# Patient Record
Sex: Male | Born: 1975 | Race: White | Hispanic: No | Marital: Single | Smoking: Former smoker
Health system: Southern US, Community
[De-identification: ages and names within clinical notes are randomized; demographics above are authoritative.]

## PROBLEM LIST (undated history)

## (undated) DIAGNOSIS — F32A Depression, unspecified: Secondary | ICD-10-CM

## (undated) DIAGNOSIS — F419 Anxiety disorder, unspecified: Secondary | ICD-10-CM

## (undated) DIAGNOSIS — K219 Gastro-esophageal reflux disease without esophagitis: Secondary | ICD-10-CM

## (undated) DIAGNOSIS — F319 Bipolar disorder, unspecified: Secondary | ICD-10-CM

---

## 2021-03-27 HISTORY — PX: FRACTURE SURGERY: SHX138

## 2021-04-02 ENCOUNTER — Inpatient Hospital Stay (HOSPITAL_COMMUNITY): Payer: Worker's Compensation | Admitting: Certified Registered Nurse Anesthetist

## 2021-04-02 ENCOUNTER — Emergency Department (HOSPITAL_COMMUNITY): Payer: Worker's Compensation

## 2021-04-02 ENCOUNTER — Encounter (HOSPITAL_COMMUNITY): Payer: Self-pay

## 2021-04-02 ENCOUNTER — Encounter (HOSPITAL_COMMUNITY): Admission: EM | Disposition: A | Discharge status: Home / Self Care | Payer: Self-pay | Source: Home / Self Care

## 2021-04-02 ENCOUNTER — Inpatient Hospital Stay (HOSPITAL_COMMUNITY)
Admission: EM | Admit: 2021-04-02 | Discharge: 2021-04-05 | DRG: 511 | Disposition: A | Discharge status: Home / Self Care | Payer: Worker's Compensation | Attending: General Surgery | Admitting: General Surgery

## 2021-04-02 DIAGNOSIS — F419 Anxiety disorder, unspecified: Secondary | ICD-10-CM | POA: Diagnosis present

## 2021-04-02 DIAGNOSIS — S02401A Maxillary fracture, unspecified, initial encounter for closed fracture: Secondary | ICD-10-CM

## 2021-04-02 DIAGNOSIS — S0232XA Fracture of orbital floor, left side, initial encounter for closed fracture: Secondary | ICD-10-CM | POA: Diagnosis present

## 2021-04-02 DIAGNOSIS — K219 Gastro-esophageal reflux disease without esophagitis: Secondary | ICD-10-CM | POA: Diagnosis present

## 2021-04-02 DIAGNOSIS — S52531A Colles' fracture of right radius, initial encounter for closed fracture: Secondary | ICD-10-CM

## 2021-04-02 DIAGNOSIS — S52201A Unspecified fracture of shaft of right ulna, initial encounter for closed fracture: Secondary | ICD-10-CM

## 2021-04-02 DIAGNOSIS — S02642A Fracture of ramus of left mandible, initial encounter for closed fracture: Secondary | ICD-10-CM | POA: Diagnosis present

## 2021-04-02 DIAGNOSIS — S52301A Unspecified fracture of shaft of right radius, initial encounter for closed fracture: Secondary | ICD-10-CM

## 2021-04-02 DIAGNOSIS — S0181XA Laceration without foreign body of other part of head, initial encounter: Secondary | ICD-10-CM | POA: Diagnosis present

## 2021-04-02 DIAGNOSIS — Y9239 Other specified sports and athletic area as the place of occurrence of the external cause: Secondary | ICD-10-CM | POA: Diagnosis not present

## 2021-04-02 DIAGNOSIS — S01112A Laceration without foreign body of left eyelid and periocular area, initial encounter: Secondary | ICD-10-CM | POA: Diagnosis present

## 2021-04-02 DIAGNOSIS — Z20822 Contact with and (suspected) exposure to covid-19: Secondary | ICD-10-CM | POA: Diagnosis present

## 2021-04-02 DIAGNOSIS — Y9353 Activity, golf: Secondary | ICD-10-CM

## 2021-04-02 DIAGNOSIS — S52291B Other fracture of shaft of right ulna, initial encounter for open fracture type I or II: Secondary | ICD-10-CM | POA: Diagnosis present

## 2021-04-02 DIAGNOSIS — S52209A Unspecified fracture of shaft of unspecified ulna, initial encounter for closed fracture: Secondary | ICD-10-CM

## 2021-04-02 DIAGNOSIS — S0240FA Zygomatic fracture, left side, initial encounter for closed fracture: Secondary | ICD-10-CM | POA: Diagnosis present

## 2021-04-02 DIAGNOSIS — Z87891 Personal history of nicotine dependence: Secondary | ICD-10-CM

## 2021-04-02 DIAGNOSIS — Z23 Encounter for immunization: Secondary | ICD-10-CM

## 2021-04-02 DIAGNOSIS — S52531B Colles' fracture of right radius, initial encounter for open fracture type I or II: Secondary | ICD-10-CM | POA: Diagnosis present

## 2021-04-02 DIAGNOSIS — S0219XA Other fracture of base of skull, initial encounter for closed fracture: Secondary | ICD-10-CM | POA: Diagnosis present

## 2021-04-02 DIAGNOSIS — S0292XA Unspecified fracture of facial bones, initial encounter for closed fracture: Secondary | ICD-10-CM

## 2021-04-02 DIAGNOSIS — M25531 Pain in right wrist: Secondary | ICD-10-CM | POA: Diagnosis present

## 2021-04-02 HISTORY — PX: ORIF RADIAL FRACTURE: SHX5113

## 2021-04-02 HISTORY — PX: ORIF ULNAR FRACTURE: SHX5417

## 2021-04-02 HISTORY — DX: Gastro-esophageal reflux disease without esophagitis: K21.9

## 2021-04-02 LAB — I-STAT CHEM 8, ED
BUN: 10 mg/dL (ref 6–20)
Calcium, Ion: 1.13 mmol/L — ABNORMAL LOW (ref 1.15–1.40)
Chloride: 102 mmol/L (ref 98–111)
Creatinine, Ser: 1.1 mg/dL (ref 0.61–1.24)
Glucose, Bld: 120 mg/dL — ABNORMAL HIGH (ref 70–99)
HCT: 44 % (ref 39.0–52.0)
Hemoglobin: 15 g/dL (ref 13.0–17.0)
Potassium: 3.7 mmol/L (ref 3.5–5.1)
Sodium: 140 mmol/L (ref 135–145)
TCO2: 28 mmol/L (ref 22–32)

## 2021-04-02 LAB — CBC
HCT: 45.9 % (ref 39.0–52.0)
Hemoglobin: 14.7 g/dL (ref 13.0–17.0)
MCH: 30 pg (ref 26.0–34.0)
MCHC: 32 g/dL (ref 30.0–36.0)
MCV: 93.7 fL (ref 80.0–100.0)
Platelets: 245 10*3/uL (ref 150–400)
RBC: 4.9 MIL/uL (ref 4.22–5.81)
RDW: 12.7 % (ref 11.5–15.5)
WBC: 9.7 10*3/uL (ref 4.0–10.5)
nRBC: 0 % (ref 0.0–0.2)

## 2021-04-02 LAB — ETHANOL: Alcohol, Ethyl (B): <10 mg/dL (ref <10)

## 2021-04-02 LAB — COMPREHENSIVE METABOLIC PANEL
ALT: 32 U/L (ref 0–44)
AST: 25 U/L (ref 15–41)
Albumin: 4 g/dL (ref 3.5–5.0)
Alkaline Phosphatase: 67 U/L (ref 38–126)
Anion gap: 8 (ref 5–15)
BUN: 8 mg/dL (ref 6–20)
CO2: 26 mmol/L (ref 22–32)
Calcium: 8.7 mg/dL — ABNORMAL LOW (ref 8.9–10.3)
Chloride: 102 mmol/L (ref 98–111)
Creatinine, Ser: 1.07 mg/dL (ref 0.61–1.24)
GFR, Estimated: >60 mL/min (ref >60)
Glucose, Bld: 131 mg/dL — ABNORMAL HIGH (ref 70–99)
Potassium: 4 mmol/L (ref 3.5–5.1)
Sodium: 136 mmol/L (ref 135–145)
Total Bilirubin: 0.6 mg/dL (ref 0.3–1.2)
Total Protein: 6.8 g/dL (ref 6.5–8.1)

## 2021-04-02 LAB — PROTIME-INR
INR: 1.1 (ref 0.8–1.2)
Prothrombin Time: 13.9 seconds (ref 11.4–15.2)

## 2021-04-02 LAB — RESP PANEL BY RT-PCR (FLU A&B, COVID) ARPGX2
Influenza A by PCR: NEGATIVE
Influenza B by PCR: NEGATIVE
SARS Coronavirus 2 by RT PCR: NEGATIVE

## 2021-04-02 LAB — LACTIC ACID, PLASMA: Lactic Acid, Venous: 1.4 mmol/L (ref 0.5–1.9)

## 2021-04-02 LAB — SAMPLE TO BLOOD BANK

## 2021-04-02 SURGERY — OPEN REDUCTION INTERNAL FIXATION (ORIF) RADIAL FRACTURE
Anesthesia: General | Laterality: Right

## 2021-04-02 MED ORDER — SODIUM CHLORIDE 0.9 % IR SOLN
Status: DC | PRN
  Administered 2021-04-02: 3000 mL

## 2021-04-02 MED ORDER — CEFAZOLIN SODIUM-DEXTROSE 2-3 GM-%(50ML) IV SOLR
INTRAVENOUS | Status: DC | PRN
  Administered 2021-04-02: 2 g via INTRAVENOUS

## 2021-04-02 MED ORDER — MIDAZOLAM HCL 5 MG/5ML IJ SOLN
INTRAMUSCULAR | Status: DC | PRN
  Administered 2021-04-02: 2 mg via INTRAVENOUS

## 2021-04-02 MED ORDER — SUCCINYLCHOLINE CHLORIDE 20 MG/ML IJ SOLN
INTRAMUSCULAR | Status: DC | PRN
  Administered 2021-04-02: 120 mg via INTRAVENOUS

## 2021-04-02 MED ORDER — DEXAMETHASONE SODIUM PHOSPHATE 10 MG/ML IJ SOLN
INTRAMUSCULAR | Status: DC | PRN
  Administered 2021-04-02: 5 mg via INTRAVENOUS

## 2021-04-02 MED ORDER — MIDAZOLAM HCL 2 MG/2ML IJ SOLN
INTRAMUSCULAR | Status: AC
  Filled 2021-04-02: qty 2

## 2021-04-02 MED ORDER — LIDOCAINE HCL (CARDIAC) PF 100 MG/5ML IV SOSY
PREFILLED_SYRINGE | INTRAVENOUS | Status: DC | PRN
  Administered 2021-04-02: 40 mg via INTRAVENOUS

## 2021-04-02 MED ORDER — FENTANYL CITRATE (PF) 250 MCG/5ML IJ SOLN
INTRAMUSCULAR | Status: AC
  Filled 2021-04-02: qty 5

## 2021-04-02 MED ORDER — LACTATED RINGERS IV SOLN: INTRAVENOUS | Status: DC | PRN

## 2021-04-02 MED ORDER — DEXMEDETOMIDINE (PRECEDEX) IN NS 20 MCG/5ML (4 MCG/ML) IV SYRINGE
PREFILLED_SYRINGE | INTRAVENOUS | Status: DC | PRN
Start: 2021-04-02 — End: 2021-04-03
  Administered 2021-04-02: 20 ug via INTRAVENOUS

## 2021-04-02 MED ORDER — SUGAMMADEX SODIUM 200 MG/2ML IV SOLN
INTRAVENOUS | Status: DC | PRN
Start: 2021-04-02 — End: 2021-04-03
  Administered 2021-04-02: 200 mg via INTRAVENOUS

## 2021-04-02 MED ORDER — ONDANSETRON HCL 4 MG/2ML IJ SOLN
INTRAMUSCULAR | Status: DC | PRN
  Administered 2021-04-02: 4 mg via INTRAVENOUS

## 2021-04-02 MED ORDER — PROPOFOL 10 MG/ML IV BOLUS
INTRAVENOUS | Status: DC | PRN
Start: 2021-04-02 — End: 2021-04-03
  Administered 2021-04-02: 120 mg via INTRAVENOUS

## 2021-04-02 MED ORDER — PROPOFOL 10 MG/ML IV BOLUS
INTRAVENOUS | Status: AC
  Filled 2021-04-02: qty 20

## 2021-04-02 MED ORDER — CEFAZOLIN SODIUM-DEXTROSE 2-4 GM/100ML-% IV SOLN
INTRAVENOUS | Status: AC
  Filled 2021-04-02: qty 100

## 2021-04-02 MED ORDER — 0.9 % SODIUM CHLORIDE (POUR BTL) OPTIME
TOPICAL | Status: DC | PRN
  Administered 2021-04-02: 1000 mL

## 2021-04-02 MED ORDER — LIDOCAINE 2% (20 MG/ML) 5 ML SYRINGE
INTRAMUSCULAR | Status: AC
  Filled 2021-04-02: qty 5

## 2021-04-02 MED ORDER — FENTANYL CITRATE (PF) 100 MCG/2ML IJ SOLN
50.0000 ug | Freq: Once | INTRAMUSCULAR | Status: AC
  Administered 2021-04-02: 50 ug via INTRAVENOUS
  Filled 2021-04-02: qty 2

## 2021-04-02 MED ORDER — CEFAZOLIN SODIUM-DEXTROSE 2-4 GM/100ML-% IV SOLN
2.0000 g | Freq: Once | INTRAVENOUS | Status: AC
  Administered 2021-04-02: 2 g via INTRAVENOUS
  Filled 2021-04-02: qty 100

## 2021-04-02 MED ORDER — ROCURONIUM BROMIDE 100 MG/10ML IV SOLN
INTRAVENOUS | Status: DC | PRN
  Administered 2021-04-02: 70 mg via INTRAVENOUS

## 2021-04-02 MED ORDER — CEFAZOLIN SODIUM-DEXTROSE 2-4 GM/100ML-% IV SOLN
2.0000 g | Freq: Three times a day (TID) | INTRAVENOUS | Status: AC
  Administered 2021-04-03 (×3): 2 g via INTRAVENOUS
  Filled 2021-04-02 (×3): qty 100

## 2021-04-02 MED ORDER — TETANUS-DIPHTH-ACELL PERTUSSIS 5-2.5-18.5 LF-MCG/0.5 IM SUSY
0.5000 mL | PREFILLED_SYRINGE | Freq: Once | INTRAMUSCULAR | Status: AC
  Administered 2021-04-02: 0.5 mL via INTRAMUSCULAR

## 2021-04-02 MED ORDER — HYDROMORPHONE HCL 1 MG/ML IJ SOLN
1.0000 mg | Freq: Once | INTRAMUSCULAR | Status: AC
  Administered 2021-04-02: 1 mg via INTRAVENOUS
  Filled 2021-04-02: qty 1

## 2021-04-02 MED ORDER — ONDANSETRON HCL 4 MG/2ML IJ SOLN
4.0000 mg | Freq: Once | INTRAMUSCULAR | Status: AC
  Administered 2021-04-02: 4 mg via INTRAVENOUS
  Filled 2021-04-02: qty 2

## 2021-04-02 MED ORDER — FENTANYL CITRATE (PF) 100 MCG/2ML IJ SOLN
INTRAMUSCULAR | Status: DC | PRN
  Administered 2021-04-02: 100 ug via INTRAVENOUS
  Administered 2021-04-02: 50 ug via INTRAVENOUS

## 2021-04-02 SURGICAL SUPPLY — 50 items
APL PRP STRL LF DISP 70% ISPRP (MISCELLANEOUS)
BIT DRILL 2.8 QUICK RELEASE (BIT) ×1 IMPLANT
BLADE SURG 15 STRL LF DISP TIS (BLADE) ×2 IMPLANT
BLADE SURG 15 STRL SS (BLADE) ×4
BNDG CMPR 9X4 STRL LF SNTH (GAUZE/BANDAGES/DRESSINGS) ×1
BNDG ELASTIC 3X5.8 VLCR STR LF (GAUZE/BANDAGES/DRESSINGS) ×2 IMPLANT
BNDG ELASTIC 4X5.8 VLCR STR LF (GAUZE/BANDAGES/DRESSINGS) ×2 IMPLANT
BNDG ESMARK 4X9 LF (GAUZE/BANDAGES/DRESSINGS) ×2 IMPLANT
BNDG GAUZE ELAST 4 BULKY (GAUZE/BANDAGES/DRESSINGS) ×2 IMPLANT
CANISTER SUCT 3000ML PPV (MISCELLANEOUS) ×2 IMPLANT
CHLORAPREP W/TINT 26 (MISCELLANEOUS) IMPLANT
CORD BIPOLAR FORCEPS 12FT (ELECTRODE) ×2 IMPLANT
COVER SURGICAL LIGHT HANDLE (MISCELLANEOUS) ×2 IMPLANT
COVER WAND RF STERILE (DRAPES) IMPLANT
CUFF TOURN SGL QUICK 18X4 (TOURNIQUET CUFF) ×2 IMPLANT
DRAPE OEC MINIVIEW 54X84 (DRAPES) ×2 IMPLANT
DRAPE SURG 17X23 STRL (DRAPES) ×2 IMPLANT
DRILL 2.8 QUICK RELEASE (BIT) ×2
DRSG XEROFORM 1X8 (GAUZE/BANDAGES/DRESSINGS) ×2 IMPLANT
GAUZE SPONGE 4X4 12PLY STRL (GAUZE/BANDAGES/DRESSINGS) ×2 IMPLANT
GAUZE SPONGE 4X4 12PLY STRL LF (GAUZE/BANDAGES/DRESSINGS) ×2 IMPLANT
GAUZE XEROFORM 1X8 LF (GAUZE/BANDAGES/DRESSINGS) IMPLANT
GLOVE INDICATOR 8.0 STRL GRN (GLOVE) ×4 IMPLANT
GLOVE SURG SYN 7.5  E (GLOVE) ×1
GLOVE SURG SYN 7.5 E (GLOVE) ×1 IMPLANT
GOWN STRL REUS W/ TWL LRG LVL3 (GOWN DISPOSABLE) ×3 IMPLANT
GOWN STRL REUS W/TWL LRG LVL3 (GOWN DISPOSABLE) ×6
KIT BASIN OR (CUSTOM PROCEDURE TRAY) ×2 IMPLANT
KIT TURNOVER KIT B (KITS) ×2 IMPLANT
NS IRRIG 1000ML POUR BTL (IV SOLUTION) ×2 IMPLANT
PACK ORTHO EXTREMITY (CUSTOM PROCEDURE TRAY) ×2 IMPLANT
PAD ARMBOARD 7.5X6 YLW CONV (MISCELLANEOUS) ×4 IMPLANT
PAD CAST 3X4 CTTN HI CHSV (CAST SUPPLIES) ×1 IMPLANT
PAD CAST 4YDX4 CTTN HI CHSV (CAST SUPPLIES) ×1 IMPLANT
PADDING CAST COTTON 3X4 STRL (CAST SUPPLIES) ×2
PADDING CAST COTTON 4X4 STRL (CAST SUPPLIES) ×2
PLATE 8 HOLE RADIUS (Plate) ×2 IMPLANT
PLATE ULNA MIDSHAFT 6 HOLE (Plate) ×2 IMPLANT
SCREW HEXALOBE NON-LOCK 3.5X14 (Screw) ×6 IMPLANT
SCREW NON LOCK 3.5X10MM (Screw) ×4 IMPLANT
SCREW NONLOCK HEX 3.5X12 (Screw) ×18 IMPLANT
SPLINT FIBERGLASS 3X35 (CAST SUPPLIES) ×2 IMPLANT
SUT PROLENE 4 0 PS 2 18 (SUTURE) ×4 IMPLANT
SUT VIC AB 3-0 PS2 18 (SUTURE) ×4 IMPLANT
SYR CONTROL 10ML LL (SYRINGE) IMPLANT
TOWEL GREEN STERILE (TOWEL DISPOSABLE) ×2 IMPLANT
TOWEL GREEN STERILE FF (TOWEL DISPOSABLE) ×2 IMPLANT
TUBE CONNECTING 12X1/4 (SUCTIONS) ×2 IMPLANT
UNDERPAD 30X36 HEAVY ABSORB (UNDERPADS AND DIAPERS) ×2 IMPLANT
WATER STERILE IRR 1000ML POUR (IV SOLUTION) ×2 IMPLANT
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
## 2021-04-02 NOTE — ED Notes (Signed)
Masayuki Sakai (Stepmother) [phone], req update on pt
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
## 2021-04-02 NOTE — ED Notes (Signed)
Pt has laceration to left eye area. Abrasions to forehead and area under nose. Swelling to face present. Face cleaned with NS.
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
## 2021-04-02 NOTE — Anesthesia Preprocedure Evaluation (Signed)
Anesthesia Evaluation    Airway Mallampati: III  TM Distance: >3 FB Neck ROM: Full  Mouth opening: Limited Mouth Opening  Dental  (+) Teeth Intact, Dental Advisory Given   Pulmonary    breath sounds clear to auscultation       Cardiovascular  Rhythm:Regular Rate:Normal     Neuro/Psych    GI/Hepatic GERD  ,  Endo/Other    Renal/GU      Musculoskeletal   Abdominal Normal abdominal exam  (+)   Peds  Hematology   Anesthesia Other Findings Multilple facial and lip abrasions.   Reproductive/Obstetrics                             Anesthesia Physical Anesthesia Plan  ASA: II and emergent  Anesthesia Plan: General   Post-op Pain Management:    Induction: Rapid sequence and Cricoid pressure planned  PONV Risk Score and Plan: 3 and Ondansetron, Dexamethasone and Midazolam  Airway Management Planned: Oral ETT  Additional Equipment: None  Intra-op Plan:   Post-operative Plan: Extubation in OR  Informed Consent:   Plan Discussed with: CRNA  Anesthesia Plan Comments:         Anesthesia Quick Evaluation

## 2021-04-02 NOTE — Anesthesia Procedure Notes (Signed)
Procedure Name: Intubation Date/Time: 04/02/2021 9:36 PM Performed by: Gennette Shadix T, CRNA Pre-anesthesia Checklist: Patient identified, Emergency Drugs available, Suction available and Patient being monitored Patient Re-evaluated:Patient Re-evaluated prior to induction Oxygen Delivery Method: Circle system utilized Preoxygenation: Pre-oxygenation with 100% oxygen Induction Type: IV induction and Rapid sequence Ventilation: Mask ventilation without difficulty Laryngoscope Size: Mac and 4 Grade View: Grade I Tube type: Oral Tube size: 7.5 mm Number of attempts: 1 Airway Equipment and Method: Stylet and Oral airway Placement Confirmation: ETT inserted through vocal cords under direct vision,  positive ETCO2 and breath sounds checked- equal and bilateral Secured at: 21 cm Tube secured with: Tape Dental Injury: Teeth and Oropharynx as per pre-operative assessment

## 2021-04-03 ENCOUNTER — Other Ambulatory Visit: Payer: Self-pay

## 2021-04-03 ENCOUNTER — Encounter (HOSPITAL_COMMUNITY): Payer: Self-pay | Admitting: General Surgery

## 2021-04-03 DIAGNOSIS — S02401A Maxillary fracture, unspecified, initial encounter for closed fracture: Secondary | ICD-10-CM

## 2021-04-03 DIAGNOSIS — S52531A Colles' fracture of right radius, initial encounter for closed fracture: Secondary | ICD-10-CM

## 2021-04-03 DIAGNOSIS — S52209A Unspecified fracture of shaft of unspecified ulna, initial encounter for closed fracture: Secondary | ICD-10-CM

## 2021-04-03 LAB — CBC
HCT: 44.7 % (ref 39.0–52.0)
Hemoglobin: 14.6 g/dL (ref 13.0–17.0)
MCH: 30.1 pg (ref 26.0–34.0)
MCHC: 32.7 g/dL (ref 30.0–36.0)
MCV: 92.2 fL (ref 80.0–100.0)
Platelets: 236 10*3/uL (ref 150–400)
RBC: 4.85 MIL/uL (ref 4.22–5.81)
RDW: 12.7 % (ref 11.5–15.5)
WBC: 13.3 10*3/uL — ABNORMAL HIGH (ref 4.0–10.5)
nRBC: 0 % (ref 0.0–0.2)

## 2021-04-03 LAB — URINALYSIS, ROUTINE W REFLEX MICROSCOPIC
Bilirubin Urine: NEGATIVE
Glucose, UA: NEGATIVE mg/dL
Hgb urine dipstick: NEGATIVE
Ketones, ur: NEGATIVE mg/dL
Leukocytes,Ua: NEGATIVE
Nitrite: NEGATIVE
Protein, ur: NEGATIVE mg/dL
Specific Gravity, Urine: 1.005 (ref 1.005–1.030)
pH: 6 (ref 5.0–8.0)

## 2021-04-03 LAB — BASIC METABOLIC PANEL
Anion gap: 10 (ref 5–15)
BUN: 6 mg/dL (ref 6–20)
CO2: 26 mmol/L (ref 22–32)
Calcium: 8.7 mg/dL — ABNORMAL LOW (ref 8.9–10.3)
Chloride: 100 mmol/L (ref 98–111)
Creatinine, Ser: 1.01 mg/dL (ref 0.61–1.24)
GFR, Estimated: >60 mL/min (ref >60)
Glucose, Bld: 144 mg/dL — ABNORMAL HIGH (ref 70–99)
Potassium: 4.2 mmol/L (ref 3.5–5.1)
Sodium: 136 mmol/L (ref 135–145)

## 2021-04-03 LAB — HIV ANTIBODY (ROUTINE TESTING W REFLEX): HIV Screen 4th Generation wRfx: NONREACTIVE

## 2021-04-03 MED ORDER — ONDANSETRON HCL 4 MG/2ML IJ SOLN
4.0000 mg | Freq: Four times a day (QID) | INTRAMUSCULAR | Status: DC | PRN
  Administered 2021-04-03: 4 mg via INTRAVENOUS
  Filled 2021-04-03: qty 2

## 2021-04-03 MED ORDER — ONDANSETRON 4 MG PO TBDP: 4.0000 mg | ORAL_TABLET | Freq: Four times a day (QID) | ORAL | Status: DC | PRN

## 2021-04-03 MED ORDER — PANTOPRAZOLE SODIUM 40 MG PO TBEC: 40.0000 mg | DELAYED_RELEASE_TABLET | Freq: Every day | ORAL | Status: DC

## 2021-04-03 MED ORDER — ACETAMINOPHEN 325 MG PO TABS: 650.0000 mg | ORAL_TABLET | ORAL | Status: DC | PRN

## 2021-04-03 MED ORDER — ACETAMINOPHEN 160 MG/5ML PO SOLN: 325.0000 mg | Freq: Once | ORAL | Status: DC | PRN

## 2021-04-03 MED ORDER — ACETAMINOPHEN 10 MG/ML IV SOLN
1000.0000 mg | Freq: Once | INTRAVENOUS | Status: DC | PRN
  Administered 2021-04-03: 1000 mg via INTRAVENOUS

## 2021-04-03 MED ORDER — OXYCODONE HCL 5 MG PO TABS
5.0000 mg | ORAL_TABLET | ORAL | Status: DC | PRN
  Administered 2021-04-04 – 2021-04-05 (×3): 5 mg via ORAL
  Filled 2021-04-03 (×3): qty 1

## 2021-04-03 MED ORDER — METHOCARBAMOL 500 MG PO TABS
750.0000 mg | ORAL_TABLET | Freq: Three times a day (TID) | ORAL | Status: DC
  Administered 2021-04-03 – 2021-04-05 (×8): 750 mg via ORAL
  Filled 2021-04-03 (×8): qty 2

## 2021-04-03 MED ORDER — LACTATED RINGERS IV SOLN: INTRAVENOUS | Status: DC

## 2021-04-03 MED ORDER — PANTOPRAZOLE SODIUM 40 MG PO TBEC
40.0000 mg | DELAYED_RELEASE_TABLET | Freq: Every day | ORAL | Status: DC
  Administered 2021-04-04 – 2021-04-05 (×2): 40 mg via ORAL
  Filled 2021-04-03 (×2): qty 1

## 2021-04-03 MED ORDER — PANTOPRAZOLE SODIUM 40 MG IV SOLR
40.0000 mg | Freq: Every day | INTRAVENOUS | Status: DC
  Administered 2021-04-03: 40 mg via INTRAVENOUS
  Filled 2021-04-03: qty 40

## 2021-04-03 MED ORDER — AMISULPRIDE (ANTIEMETIC) 5 MG/2ML IV SOLN: 10.0000 mg | Freq: Once | INTRAVENOUS | Status: DC | PRN

## 2021-04-03 MED ORDER — MORPHINE SULFATE (PF) 4 MG/ML IV SOLN
4.0000 mg | INTRAVENOUS | Status: DC | PRN
  Administered 2021-04-03 – 2021-04-04 (×3): 4 mg via INTRAVENOUS
  Filled 2021-04-03 (×3): qty 1

## 2021-04-03 MED ORDER — PROMETHAZINE HCL 25 MG/ML IJ SOLN: 6.2500 mg | INTRAMUSCULAR | Status: DC | PRN

## 2021-04-03 MED ORDER — KETOROLAC TROMETHAMINE 15 MG/ML IJ SOLN
15.0000 mg | Freq: Once | INTRAMUSCULAR | Status: AC
  Administered 2021-04-03: 15 mg via INTRAVENOUS
  Filled 2021-04-03: qty 1

## 2021-04-03 MED ORDER — POTASSIUM CHLORIDE IN NACL 20-0.45 MEQ/L-% IV SOLN
INTRAVENOUS | Status: DC
  Filled 2021-04-03 (×6): qty 1000

## 2021-04-03 MED ORDER — OXYCODONE HCL 5 MG PO TABS
10.0000 mg | ORAL_TABLET | ORAL | Status: DC | PRN
  Administered 2021-04-03 – 2021-04-05 (×7): 10 mg via ORAL
  Filled 2021-04-03 (×8): qty 2

## 2021-04-03 MED ORDER — PANTOPRAZOLE SODIUM 40 MG IV SOLR: 40.0000 mg | Freq: Every day | INTRAVENOUS | Status: DC

## 2021-04-03 MED ORDER — ENOXAPARIN SODIUM 30 MG/0.3ML IJ SOSY
30.0000 mg | PREFILLED_SYRINGE | Freq: Two times a day (BID) | INTRAMUSCULAR | Status: DC
  Administered 2021-04-03 – 2021-04-05 (×4): 30 mg via SUBCUTANEOUS
  Filled 2021-04-03 (×4): qty 0.3

## 2021-04-03 MED ORDER — ACETAMINOPHEN 10 MG/ML IV SOLN
INTRAVENOUS | Status: AC
  Filled 2021-04-03: qty 100

## 2021-04-03 MED ORDER — ACETAMINOPHEN 325 MG PO TABS: 325.0000 mg | ORAL_TABLET | Freq: Once | ORAL | Status: DC | PRN

## 2021-04-03 MED ORDER — MEPERIDINE HCL 25 MG/ML IJ SOLN: 6.2500 mg | INTRAMUSCULAR | Status: DC | PRN

## 2021-04-03 MED ORDER — HYDROMORPHONE HCL 1 MG/ML IJ SOLN: 0.2500 mg | INTRAMUSCULAR | Status: DC | PRN

## 2021-04-03 NOTE — Progress Notes (Signed)
   Ortho Hand Progress Note  Subjective: No acute events last night.  Pain in right arm controlled.  Denies numbness or tingling.   Objective: Vital signs in last 24 hours: Temp:  [97.6 F (36.4 C)-98.6 F (37 C)] 98.1 F (36.7 C) (05/08 1418) Pulse Rate:  [43-123] 72 (05/08 1418) Resp:  [11-24] 18 (05/08 1418) BP: (118-146)/(73-95) 118/82 (05/08 1418) SpO2:  [90 %-100 %] 96 % (05/08 1418) Weight:  [77.1 kg] 77.1 kg (05/07 1727)  Intake/Output from previous day: 05/07 0701 - 05/08 0700 In: 1374.5 [I.V.:1274.5; IV Piggyback:100] Out: 1975 [Urine:1950; Blood:25] Intake/Output this shift: No intake/output data recorded.  Recent Labs    04/02/21 1725 04/02/21 1758 04/03/21 0144  HGB 14.7 15.0 14.6   Recent Labs    04/02/21 1725 04/02/21 1758 04/03/21 0144  WBC 9.7  --  13.3*  RBC 4.90  --  4.85  HCT 45.9 44.0 44.7  PLT 245  --  236   Recent Labs    04/02/21 1725 04/02/21 1758 04/03/21 0144  NA 136 140 136  K 4.0 3.7 4.2  CL 102 102 100  CO2 26  --  26  BUN 8 10 6   CREATININE 1.07 1.10 1.01  GLUCOSE 131* 120* 144*  CALCIUM 8.7*  --  8.7*   Recent Labs    04/02/21 1725  INR 1.1    Aaox3 nad Resp nonlabored RRR Right upper extremity: Sugar-tong splint in place to the right arm exposed digits with minimal swelling.  Intact active flexion and extension throughout all digits.  Motor intact to the AIN, PIN and ulnar nerve distributions.  Fingertips are warm well perfused with brisk capillary refill.  Intact sensation throughout all fingertips.   Assessment/Plan: Right type I open radius and ulna fracture status post irrigation and debridement of open fracture and ORIF of radius and ulnar shaft fractures.  Date of surgery 04/02/2021  - Trauma primary. Appreciate management - Elevate RUE - NWB RUE. Encourage digit ROM - Finish IV abx for open fracture coverage  Remainder per primary  OK for discharge from hand standpoint. Follow up in 10-14 days post  op   08-22-1979 III 04/03/2021, 2:44 PM  [phone]

## 2021-04-03 NOTE — Progress Notes (Signed)
NEW ADMISSION NOTE New Admission Note:    Arrival Method: Bed Mental Orientation: A&O x4 Telemetry: none   Assessment: Completed Skin: See assessment  IV: Clean, dry, intact Pain: 6/10 Tubes: none Safety Measures: Safety Fall Prevention Plan has been given, discussed and signed Admission: Completed 5 [place]  Orientation: Patient has been orientated to the room, unit and staff.  Family: none present   Orders have been reviewed and implemented. Will continue to monitor the patient. Call light has been placed within reach and bed alarm has been activated.    Robert Bellow, RN
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
## 2021-04-03 NOTE — Anesthesia Postprocedure Evaluation (Signed)
Anesthesia Post Note  Patient: Chris Stanley  Procedure(s) Performed: OPEN REDUCTION INTERNAL FIXATION (ORIF) RADIAL FRACTURE (Right ) OPEN REDUCTION INTERNAL FIXATION (ORIF) ULNAR FRACTURE (Right )     Patient location during evaluation: PACU Anesthesia Type: General Level of consciousness: awake and alert Pain management: pain level controlled Vital Signs Assessment: post-procedure vital signs reviewed and stable Respiratory status: spontaneous breathing, nonlabored ventilation, respiratory function stable and patient connected to nasal cannula oxygen Cardiovascular status: blood pressure returned to baseline and stable Postop Assessment: no apparent nausea or vomiting Anesthetic complications: no   No complications documented.  Last Vitals:  Vitals:   04/03/21 0030 04/03/21 0100  BP: (!) 146/90 (!) 132/92  Pulse: 78 86  Resp: 14 16  Temp: 36.6 C 37 C  SpO2: 100% 99%    Last Pain:  Vitals:   04/03/21 0240  TempSrc:   PainSc: Asleep                 Shelton Silvas

## 2021-04-03 NOTE — Plan of Care (Signed)

## 2021-04-03 NOTE — Progress Notes (Signed)
Bedside shift report complete. Received patient awake,alert/orientedx4 and able to verbalize needs. NAD noted; respirations easy/even on room air. Facial bruising/ abrasions noted to L side. Bruised L eye noted. Splint/dressing to RUE c/d/i. Movement noted to all extremities. Whiteboard updated. All safety measures in place and personal belongings within reach.

## 2021-04-03 NOTE — Progress Notes (Signed)
Limited information obtained for admission documents as patient still under anesthesia. Will follow up.

## 2021-04-03 NOTE — Plan of Care (Signed)

## 2021-04-03 NOTE — Transfer of Care (Signed)
Immediate Anesthesia Transfer of Care Note  Patient: Chris Stanley  Procedure(s) Performed: OPEN REDUCTION INTERNAL FIXATION (ORIF) RADIAL FRACTURE (Right ) OPEN REDUCTION INTERNAL FIXATION (ORIF) ULNAR FRACTURE (Right )  Patient Location: PACU  Anesthesia Type:General  Level of Consciousness: drowsy  Airway & Oxygen Therapy: Patient Spontanous Breathing and Patient connected to nasal cannula oxygen  Post-op Assessment: Report given to RN, Post -op Vital signs reviewed and stable and Patient moving all extremities  Post vital signs: Reviewed and stable  Last Vitals:  Vitals Value Taken Time  BP 125/73 04/02/21 2359  Temp    Pulse 99 04/03/21 0002  Resp 16 04/03/21 0002  SpO2 100 % 04/03/21 0002  Vitals shown include unvalidated device data.  Last Pain:  Vitals:   04/02/21 1949  TempSrc: Oral  PainSc:          Complications: No complications documented.

## 2021-04-04 ENCOUNTER — Inpatient Hospital Stay (HOSPITAL_COMMUNITY): Payer: Worker's Compensation

## 2021-04-04 ENCOUNTER — Encounter (HOSPITAL_BASED_OUTPATIENT_CLINIC_OR_DEPARTMENT_OTHER): Payer: Self-pay | Admitting: Plastic Surgery

## 2021-04-04 ENCOUNTER — Other Ambulatory Visit: Payer: Self-pay

## 2021-04-04 DIAGNOSIS — S02642A Fracture of ramus of left mandible, initial encounter for closed fracture: Secondary | ICD-10-CM

## 2021-04-04 MED ORDER — IOHEXOL 300 MG/ML  SOLN
75.0000 mL | Freq: Once | INTRAMUSCULAR | Status: AC | PRN
  Administered 2021-04-04: 75 mL via INTRAVENOUS

## 2021-04-04 MED ORDER — IOHEXOL 9 MG/ML PO SOLN
500.0000 mL | ORAL | Status: AC
  Administered 2021-04-04 (×2): 500 mL via ORAL

## 2021-04-04 MED ORDER — BACITRACIN ZINC 500 UNIT/GM EX OINT
TOPICAL_OINTMENT | CUTANEOUS | Status: DC | PRN
  Filled 2021-04-04: qty 28.4

## 2021-04-04 MED ORDER — HYDROXYZINE HCL 25 MG PO TABS: 25.0000 mg | ORAL_TABLET | Freq: Three times a day (TID) | ORAL | Status: DC | PRN

## 2021-04-04 MED ORDER — NOREPINEPHRINE 4 MG/250ML-% IV SOLN
INTRAVENOUS | Status: AC
  Filled 2021-04-04: qty 250
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
## 2021-04-04 NOTE — Progress Notes (Signed)
Bedside shift report complete. Received patient awake,alert/orientedx4 and able to verbalize needs. NAD noted; respirations easy/even on room air. Facial bruising/ abrasions noted to L side. Bruised L eye noted. Splint/dressing to RUE c/d/i. Movement noted to all extremities. Whiteboard updated. SCDs on to bilateral lower extremities. All safety measures in place and personal belongings within reach.
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
## 2021-04-04 NOTE — Plan of Care (Signed)
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
## 2021-04-04 NOTE — Progress Notes (Signed)
   Ortho Hand Progress Note  Subjective: No acute events last night.  Pain in right arm controlled.  Denies numbness or tingling.   Objective: Vital signs in last 24 hours: Temp:  [98.1 F (36.7 C)-98.6 F (37 C)] 98.4 F (36.9 C) (05/09 0536) Pulse Rate:  [60-74] 60 (05/09 0536) Resp:  [18] 18 (05/09 0536) BP: (113-147)/(82-86) 113/82 (05/09 0536) SpO2:  [96 %-100 %] 100 % (05/09 0536)  Intake/Output from previous day: 05/08 0701 - 05/09 0700 In: -  Out: 2125 [Urine:2125] Intake/Output this shift: No intake/output data recorded.  Recent Labs    04/02/21 1725 04/02/21 1758 04/03/21 0144  HGB 14.7 15.0 14.6   Recent Labs    04/02/21 1725 04/02/21 1758 04/03/21 0144  WBC 9.7  --  13.3*  RBC 4.90  --  4.85  HCT 45.9 44.0 44.7  PLT 245  --  236   Recent Labs    04/02/21 1725 04/02/21 1758 04/03/21 0144  NA 136 140 136  K 4.0 3.7 4.2  CL 102 102 100  CO2 26  --  26  BUN 8 10 6   CREATININE 1.07 1.10 1.01  GLUCOSE 131* 120* 144*  CALCIUM 8.7*  --  8.7*   Recent Labs    04/02/21 1725  INR 1.1    Aaox3 nad Resp nonlabored RRR Right upper extremity: Sugar-tong splint in place to the right arm exposed digits with minimal swelling.  Intact active flexion and extension throughout all digits.  Motor intact to the AIN, PIN and ulnar nerve distributions.  Fingertips are warm well perfused with brisk capillary refill.  Intact sensation throughout all fingertips.   Assessment/Plan: Right type I open radius and ulna fracture status post irrigation and debridement of open fracture and ORIF of radius and ulnar shaft fractures.  Date of surgery 04/02/2021  - Trauma primary. Appreciate management - Elevate RUE - NWB RUE. Encourage digit ROM - abx completed  Remainder per primary  OK for discharge from hand standpoint. Follow up in 10-14 days post op   08-22-1979 III 04/04/2021, 8:13 AM  [phone]
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
## 2021-04-05 ENCOUNTER — Inpatient Hospital Stay (HOSPITAL_COMMUNITY): Payer: Worker's Compensation

## 2021-04-05 ENCOUNTER — Other Ambulatory Visit (HOSPITAL_COMMUNITY): Payer: Medicaid Other

## 2021-04-05 LAB — CBC
HCT: 43.6 % (ref 39.0–52.0)
Hemoglobin: 14.3 g/dL (ref 13.0–17.0)
MCH: 30 pg (ref 26.0–34.0)
MCHC: 32.8 g/dL (ref 30.0–36.0)
MCV: 91.4 fL (ref 80.0–100.0)
Platelets: 207 10*3/uL (ref 150–400)
RBC: 4.77 MIL/uL (ref 4.22–5.81)
RDW: 12.5 % (ref 11.5–15.5)
WBC: 6.1 10*3/uL (ref 4.0–10.5)
nRBC: 0 % (ref 0.0–0.2)

## 2021-04-05 LAB — SARS CORONAVIRUS 2 (TAT 6-24 HRS): SARS Coronavirus 2: NEGATIVE

## 2021-04-05 MED ORDER — POLYETHYLENE GLYCOL 3350 17 G PO PACK: 17.0000 g | PACK | Freq: Every day | ORAL | 0 refills | Status: DC | PRN

## 2021-04-05 MED ORDER — POLYETHYLENE GLYCOL 3350 17 G PO PACK: 17.0000 g | PACK | Freq: Every day | ORAL | Status: DC | PRN

## 2021-04-05 MED ORDER — DOCUSATE SODIUM 100 MG PO CAPS
100.0000 mg | ORAL_CAPSULE | Freq: Two times a day (BID) | ORAL | Status: DC
  Administered 2021-04-05: 100 mg via ORAL
  Filled 2021-04-05: qty 1

## 2021-04-05 MED ORDER — DOCUSATE SODIUM 100 MG PO CAPS: 100.0000 mg | ORAL_CAPSULE | Freq: Two times a day (BID) | ORAL | 0 refills | Status: AC

## 2021-04-05 MED ORDER — BACITRACIN ZINC 500 UNIT/GM EX OINT: TOPICAL_OINTMENT | CUTANEOUS | 0 refills | Status: DC | PRN

## 2021-04-05 MED ORDER — OXYCODONE HCL 5 MG PO TABS: 5.0000 mg | ORAL_TABLET | Freq: Four times a day (QID) | ORAL | 0 refills | Status: AC | PRN

## 2021-04-05 MED ORDER — METHOCARBAMOL 500 MG PO TABS: 750.0000 mg | ORAL_TABLET | Freq: Three times a day (TID) | ORAL | 0 refills | Status: AC | PRN

## 2021-04-05 MED ORDER — ACETAMINOPHEN 325 MG PO TABS: 650.0000 mg | ORAL_TABLET | ORAL | 0 refills | Status: AC | PRN
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
## 2021-04-05 NOTE — TOC Progression Note (Addendum)
Transition of Care [facility]) - Progression Note    Patient Details  Name: Chris Stanley MRN: [id number] Date of Birth: January 02, 1976  [facility] CM/SW Contact  Ralene Bathe, LCSWA Phone Number: 04/05/2021, 1:27 PM  Clinical Narrative:    CSW spoke with Marrianne Mood, [phone] who is the case manager handling the patient's worker's comp.  Ms. Grace Isaac stated that the agency has a provider for medications, DME, HH, and any other needs that the patient may have a d/c.  Ms. Grace Isaac emailed a copy of the agencies that provide the different services to CSW.    CSW will follow up with Ms. Watts prior to patient's d/c.    15:40-  Patient will d/c to a friend's home at [address] [place], [place] [address].  CSW informed Ms. Watts with worker's comp of the patient's upcoming appointments and that it is recommended that the patient receive Outpatient OT once his cast is removed.  CSW was informed that the patient will not need the DME (tub shower bench).     CSW will sign off for now as social work intervention is no longer needed. Please consult [place] again if new needs arise.          Expected Discharge Plan and Services                                                 Social Determinants of Health (SDOH) Interventions    Readmission Risk Interventions No flowsheet data found.

## 2021-04-05 NOTE — Progress Notes (Signed)
Patient given discharge instructions and stated understanding. 
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
## 2021-04-05 NOTE — Discharge Instructions (Signed)
Full Liquid Diet A full liquid diet refers to fluids and foods that are liquid, or will become liquid, at room temperature. This diet should only be used for a short period of time to help you recover from illness or surgery. Your health care provider or dietitian will help determine when it is safe to eat regular foods again. What are tips for following this plan? Reading food labels  Check food labels of nutrition shakes for the amount of protein. Look for nutrition shakes that have at least 8-10 grams of protein in each serving.  Choose drinks, such as milks and juices, that are "fortified" or "enriched." This means that vitamins and minerals have been added. Shopping  Buy pre-made nutrition shakes to keep on hand.  To vary your choices, buy different flavors of milks and shakes. Meal planning  Choose flavors and foods that you enjoy.  To make sure you get enough energy and calories from food: ? Have three full liquid meals each day. Have a liquid snack between each meal. ? Drink 6-8 oz (177-237 mL) of a nutritional supplement shake with meals or as snacks. ? Add protein powder, powdered milk, milk, or yogurt to shakes to increase the amount of protein.  Drink at least one serving a day of citrus fruit juice or fruit juice that has vitamin C added. General guidelines  Before starting the full liquid diet, check with your health care provider to know what foods you should avoid. These may include full-fat or high-fiber liquids.  You may have any liquid or food that becomes a liquid at room temperature. The food is considered a liquid if it can be poured off a spoon at room temperature.  Do not drink alcohol unless approved by your health care provider.  This diet gives you most of the nutrients that you need for energy, but you may not get enough of certain vitamins, minerals, and fiber. Make sure to talk to your health care provider or dietitian about: ? How many calories you need  to eat each day. ? How much fluid you should have each day. ? Taking a multivitamin or a nutritional supplement. What foods should I eat? Fruits Fruit juice without pulp. Strained fruit pures (seeds and skins removed). Vegetables Pulp-free tomato or vegetable juice. Vegetables pured in soup. Grains Thin, hot cereal, such as farina. Soft-cooked pasta or rice pured in soup. Meats and other proteins Beef, chicken, and fish broths. Powdered protein supplements. Dairy Milk and milk-based beverages, including milk shakes and instant breakfast mixes. Smooth yogurt. Pured cottage cheese. Fats and oils Melted margarine and butter. Cream. Canola, almond, avocado, corn, grapeseed, sunflower, and sesame oils. Gravy. Beverages Water. Coffee and tea (caffeinated or decaffeinated). Cocoa. Liquid nutritional supplements. Soft drinks. Nondairy milks, such as almond, coconut, rice, or soy milk. Sweets and desserts Custard. Pudding. Flavored gelatin. Smooth ice cream (without nuts or candy pieces). Sherbet. Frozen ice pops. [place] ice. Pudding pops. Seasonings and condiments Salt and pepper. Spices. Vinegar. Ketchup. Yellow mustard. Smooth sauces, such as Hollandaise, cheese sauce, or white sauce. Soy sauce. Syrup. Honey. Jelly (without fruit pieces). Other foods Cocoa powder. Cream soups. Strained soups. The items listed above may not be a complete list of foods and beverages you can eat. Contact a dietitian for more information.   What foods should I avoid? Fruits All whole fresh, frozen, or canned fruits. Vegetables All whole fresh, frozen, or canned vegetables. Grains Whole grains. Pasta. Rice. Cold cereal. Bread. Crackers. Meats and other  proteins All cuts of meat, poultry, and fish. Eggs. Tofu and soy protein. Nuts and nut butters. Precooked or cured meat, such as sausages or meat loaves. Dairy Hard cheese. Yogurt with fruit chunks. Fats and oils Coconut oil. Palm oil. Lard. Cold  butter. Sweets and desserts Ice cream or other frozen desserts that contain solids, such as nuts, chocolate chips, and pieces of cookies. Cakes. Cookies. Candy. Seasonings and condiments Stone-ground mustard. Other foods Soups with chunks or pieces. The items listed above may not be a complete list of foods and beverages you should avoid. Contact a dietitian for more information. Summary  A full liquid diet refers to fluids and foods that are liquid or will become liquid at room temperature.  This diet should only be used for a short period of time to help you recover from illness or surgery. Ask your health care provider or dietitian when it is safe for you to eat regular foods.  To make sure you get enough calories and nutrients, eat three meals each day with snacks in between. Drink pre-made nutritional supplement shakes or add protein powder to homemade shakes. Talk to your health care provider about taking a vitamin and mineral supplement. This information is not intended to replace advice given to you by your health care provider. Make sure you discuss any questions you have with your health care provider. Document Revised: 08/31/2020 Document Reviewed: 08/31/2020 Elsevier Patient Education  2021 [employer]    Cast or Splint Care, Adult Please maintain non-weight bearing to the right upper extremity. Follow up with orthopedics  Casts and splints are supports that are worn to protect broken bones and other injuries. A cast or splint may hold a bone still and in the correct position while it heals. Casts and splints may also help to ease pain, swelling, and muscle spasms. A cast is a hardened support that is usually made of fiberglass or plaster. It is custom-fit to the body and offers more protection than a splint. Most casts cannot be taken off and put back on. A splint is a type of soft support that is usually made from cloth and elastic. It can be adjusted or taken off as needed.  Often, splints are used on broken bones at first. Later, a cast can replace the splint after the swelling goes down. What are the risks? In some cases, wearing a cast or splint can cause a reduced blood supply to the wrist or hand or to the foot and toes. This can happen if there is a lot of swelling or if the cast or splint is too tight. Limited blood supply can cause a problem called compartment syndrome. This can lead to lasting damage. Symptoms include:  Pain that is getting worse.  Numbness and tingling.  Changes in skin color, including paleness or a bluish color.  Cold fingers or toes. Other problems from wearing a cast or splint can include:  Skin irritation that can cause: ? Itching. ? Rash. ? Skin sores. ? Skin infection.  Limb stiffness or weakness. How to care for your cast  Check the skin around it every day. Tell your doctor about any concerns.  Do not stick anything inside it to scratch your skin.  You may put lotion on dry skin around the edges of the cast. Do not put lotion on the skin underneath it.  Keep it clean and dry.   How to care for your splint  Wear the splint as told by your doctor.  Take it off only as told by your doctor.  Check the skin around it every day. Tell your doctor about any concerns.  Loosen it if your fingers or toes tingle, get numb, or turn cold and blue.  Keep it clean and dry. Clean your splint as told by your doctor. Use mild soap and water and let it air-dry. Do not use heat on the splint. Follow these instructions at home: Bathing  Do not take baths, swim, or use a hot tub until your doctor approves. Ask your doctor if you may take showers. You may only be allowed to take sponge baths.  If the cast or splint is not waterproof: ? Do not let it get wet. ? Cover it with a watertight covering when you take a bath or a shower. Managing pain, stiffness, and swelling  If told, put ice on the affected area. To do this: ? If you  have a cast or splint that can be taken off, take it off as told by your doctor. ? Put ice in a plastic bag. ? Place a towel between your skin and the bag or between your cast and the bag. ? Leave the ice on for 20 minutes, 2-3 times a day.  Move your fingers or toes often.  Raise (elevate) the injured area above the level of your heart while you are sitting or lying down.   Safety  Do not use your injured leg or foot to support your body weight until your doctor says that you can.  Use crutches or other helpful (assistive) devices as told by your doctor.  Ask your doctor when it is safe to drive if you have a cast or splint on part of your body. General instructions  Do not put pressure on any part of the cast or splint until it is fully hardened. This may take many hours.  Take over-the-counter and prescription medicines only as told by your doctor.  Do not use any products that contain nicotine or tobacco, such as cigarettes, e-cigarettes, and chewing tobacco. These can delay healing. If you need help quitting, ask your doctor.  Return to your normal activities as told by your doctor. Ask your doctor what activities are safe for you.  Keep all follow-up visits as told by your doctor. This is important. Contact a doctor if:  The skin around the cast or splint gets red or raw.  The skin under the cast is very itchy or painful.  Your cast or splint: ? Gets damaged. ? Feels very uncomfortable. ? Is too tight or too loose.  Your cast becomes wet or it starts to have a soft spot or area.  There is a bad smell coming from under your cast.  You get an object stuck under your cast. Get help right away if:  You get any symptoms of compartment syndrome, such as: ? Very bad pain or pressure under the cast. ? Numbness, tingling, coldness, or pale or bluish skin.  The part of your body above or below the cast is swollen, and it turns a different color (is discolored).  You  cannot feel or move your fingers or toes.  Your pain gets worse.  There is fluid leaking through the cast.  You have trouble breathing or shortness of breath.  You have chest pain. Summary  Casts and splints are worn to protect broken bones and other injuries.  Most casts cannot be taken off, and most splints can be taken off.  Keep your cast or splint clean and dry.  Take off your cast or splint only as told by your doctor.  Get help right away if you have very bad pain, numbness, tingling, or skin that turns cold or another color. This information is not intended to replace advice given to you by your health care provider. Make sure you discuss any questions you have with your health care provider. Document Revised: 07/31/2019 Document Reviewed: 07/31/2019 Elsevier Patient Education  2021 [employer] Inc.   Abrasion An abrasion is a cut or a scrape on your skin. You must take care of your wound so germs do not get in it and cause infection. What are the causes? This condition is caused by rubbing your skin on something or falling on a surface, such as the ground. When your skin rubs on something, some layers of skin may rub off. What are the signs or symptoms?  A cut or a scrape.  Bleeding.  A red or pink spot.  A bruise under your wound. How is this treated? This condition may be treated with:  Cleaning your wound.  Putting ointment on your wound.  Putting a bandage on your wound.  Getting a tetanus shot. Follow these instructions at home: Your doctor may tell you to do these things: Medicines  Take or use over-the-counter and prescription medicines only as told by your doctor.  If you were prescribed an antibiotic medicine, use it as told by your doctor. Do not stop using it even if you start to feel better. Keep your wound clean  Clean your wound 1 or 2 times a day or as often told by your doctor. To do this: 1. Wash your hands for at least 20 seconds with  mild soap and water. Do this before and after you clean your wound. 2. Wash your wound with mild soap and water. 3. Rinse off the soap. 4. Pat your wound with a clean towel to dry it. Do not rub your wound.  Keep your bandage clean and dry. Take it off and change it as told by your doctor. ? You may have to change your bandage one or more times a day, or as told by your doctor. Watch for signs of infection Check your wound every day for signs of infection. Check for:  A red streak that goes away from your wound.  Other redness.  Swelling or more pain.  Warmth.  Blood, fluid, pus, or a bad smell. Treat pain and swelling  If told, put ice on the injured area. To do this: ? Put ice in a plastic bag. ? Place a towel between your skin and the bag. ? Leave the ice on for 20 minutes, 2-3 times a day. ? Take off the ice if your skin turns bright red. This is very important. If you cannot feel pain, heat, or cold, you have a greater risk of damage to the area.  If you can, raise the injured area above the level of your heart while you are sitting or lying down.   General instructions  Do not take baths, swim, or use a hot tub. Ask your doctor about taking showers or sponge baths.  Keep all follow-up visits. Contact a doctor if:  You had a tetanus shot, and you have any of these where the needle went in: ? Swelling. ? Very bad pain. ? Redness. ? Bleeding.  You have a lot of pain, and medicine does not help.  You have a  fever.  You have any of these signs of infection in your wound: ? Redness, swelling, or more pain. ? Blood, fluid, pus, or a bad smell. ? Warmth. Get help right away if:  You have a red streak going away from your wound. Summary  An abrasion is a cut or a scrape on your skin. Take care of your wound so germs do not get in it.  Clean your wound 1 or 2 times a day or as often as told. Change your bandage as told and use medicines as told.  Call your doctor  if you have a fever or if you have redness, swelling, or more pain in your wound.  Call your doctor if you have blood, fluid, pus, or a bad smell in your wound.  Get help right away if you have a red streak going away from your wound. This information is not intended to replace advice given to you by your health care provider. Make sure you discuss any questions you have with your health care provider. Document Revised: 02/12/2020 Document Reviewed: 02/12/2020 Elsevier Patient Education  2021 [employer] Inc.    Wound Care, Adult Taking care of your wound properly can help to prevent pain, infection, and scarring. It can also help your wound heal more quickly. Follow instructions from your health care provider about how to care for your wound. Supplies needed:  Soap and water.  Wound cleanser.  Gauze.  If needed, a clean bandage (dressing) or other type of wound dressing material to cover or place in the wound. Follow your health care provider's instructions about what dressing supplies to use.  Cream or ointment to apply to the wound, if told by your health care provider. How to care for your wound Cleaning the wound Ask your health care provider how to clean the wound. This may include:  Using mild soap and water or a wound cleanser.  Using a clean gauze to pat the wound dry after cleaning it. Do not rub or scrub the wound. Dressing care  Wash your hands with soap and water for at least 20 seconds before and after you change the dressing. If soap and water are not available, use hand sanitizer.  Change your dressing as told by your health care provider. This may include: ? Cleaning or rinsing out (irrigating) the wound. ? Placing a dressing over the wound or in the wound (packing). ? Covering the wound with an outer dressing.  Leave any stitches (sutures), skin glue, or adhesive strips in place. These skin closures may need to stay in place for 2 weeks or longer. If adhesive  strip edges start to loosen and curl up, you may trim the loose edges. Do not remove adhesive strips completely unless your health care provider tells you to do that.  Ask your health care provider when you can leave the wound uncovered. Checking for infection Check your wound area every day for signs of infection. Check for:  More redness, swelling, or pain.  Fluid or blood.  Warmth.  Pus or a bad smell.   Follow these instructions at home Medicines  If you were prescribed an antibiotic medicine, cream, or ointment, take or apply it as told by your health care provider. Do not stop using the antibiotic even if your condition improves.  If you were prescribed pain medicine, take it 30 minutes before you do any wound care or as told by your health care provider.  Take over-the-counter and prescription medicines only as  told by your health care provider. Eating and drinking  Eat a diet that includes protein, vitamin A, vitamin C, and other nutrient-rich foods to help the wound heal. ? Foods rich in protein include meat, fish, eggs, dairy, beans, and nuts. ? Foods rich in vitamin A include carrots and dark green, leafy vegetables. ? Foods rich in vitamin C include citrus fruits, tomatoes, broccoli, and peppers.  Drink enough fluid to keep your urine pale yellow. General instructions  Do not take baths, swim, use a hot tub, or do anything that would put the wound underwater until your health care provider approves. Ask your health care provider if you may take showers. You may only be allowed to take sponge baths.  Do not scratch or pick at the wound. Keep it covered as told by your health care provider.  Return to your normal activities as told by your health care provider. Ask your health care provider what activities are safe for you.  Protect your wound from the sun when you are outside for the first 6 months, or for as long as told by your health care provider. Cover up the scar  area or apply sunscreen that has an SPF of at least 30.  Do not use any products that contain nicotine or tobacco, such as cigarettes, e-cigarettes, and chewing tobacco. These may delay wound healing. If you need help quitting, ask your health care provider.  Keep all follow-up visits as told by your health care provider. This is important. Contact a health care provider if:  You received a tetanus shot and you have swelling, severe pain, redness, or bleeding at the injection site.  Your pain is not controlled with medicine.  You have any of these signs of infection: ? More redness, swelling, or pain around the wound. ? Fluid or blood coming from the wound. ? Warmth coming from the wound. ? Pus or a bad smell coming from the wound. ? A fever or chills.  You are nauseous or you vomit.  You are dizzy. Get help right away if:  You have a red streak of skin near the area around your wound.  Your wound has been closed with staples, sutures, skin glue, or adhesive strips and it begins to open up and separate.  Your wound is bleeding, and the bleeding does not stop with gentle pressure.  You have a rash.  You faint.  You have trouble breathing. These symptoms may represent a serious problem that is an emergency. Do not wait to see if the symptoms will go away. Get medical help right away. Call your local emergency services (911 in the [place] Do not drive yourself to the hospital. Summary  Always wash your hands with soap and water for at least 20 seconds before and after changing your dressing.  Change your dressing as told by your health care provider.  To help with healing, eat foods that are rich in protein, vitamin A, vitamin C, and other nutrients.  Check your wound every day for signs of infection. Contact your health care provider if you suspect that your wound is infected. This information is not intended to replace advice given to you by your health care provider. Make  sure you discuss any questions you have with your health care provider. Document Revised: 08/29/2019 Document Reviewed: 08/29/2019 Elsevier Patient Education  2021 [employer].

## 2021-04-05 NOTE — Plan of Care (Signed)
  Problem: Education: Goal: Knowledge of General Education information will improve Description: Including pain rating scale, medication(s)/side effects and non-pharmacologic comfort measures Outcome: Adequate for Discharge   Problem: Health Behavior/Discharge Planning: Goal: Ability to manage health-related needs will improve Outcome: Adequate for Discharge   Problem: Clinical Measurements: Goal: Ability to maintain clinical measurements within normal limits will improve Outcome: Adequate for Discharge Goal: Will remain free from infection Outcome: Adequate for Discharge Goal: Diagnostic test results will improve Outcome: Adequate for Discharge Goal: Respiratory complications will improve Outcome: Adequate for Discharge Goal: Cardiovascular complication will be avoided Outcome: Adequate for Discharge   Problem: Activity: Goal: Risk for activity intolerance will decrease Outcome: Adequate for Discharge   Problem: Nutrition: Goal: Adequate nutrition will be maintained Outcome: Adequate for Discharge   Problem: Coping: Goal: Level of anxiety will decrease Outcome: Adequate for Discharge   Problem: Elimination: Goal: Will not experience complications related to bowel motility Outcome: Adequate for Discharge Goal: Will not experience complications related to urinary retention Outcome: Adequate for Discharge   Problem: Pain Managment: Goal: General experience of comfort will improve Outcome: Adequate for Discharge   Problem: Safety: Goal: Ability to remain free from injury will improve Outcome: Adequate for Discharge   Problem: Skin Integrity: Goal: Risk for impaired skin integrity will decrease Outcome: Adequate for Discharge   Problem: Education: Goal: Required Educational Video(s) Outcome: Adequate for Discharge   Problem: Clinical Measurements: Goal: Ability to maintain clinical measurements within normal limits will improve Outcome: Adequate for  Discharge Goal: Postoperative complications will be avoided or minimized Outcome: Adequate for Discharge   Problem: Skin Integrity: Goal: Demonstration of wound healing without infection will improve Outcome: Adequate for Discharge   Problem: Acute Rehab OT Goals (only OT should resolve) Goal: Pt. Will Perform Upper Body Dressing Outcome: Adequate for Discharge Goal: Pt. Will Perform Tub/Shower Transfer Outcome: Adequate for Discharge Goal: Pt/Caregiver Will Perform Home Exercise Program Outcome: Adequate for Discharge Goal: OT Additional ADL Goal #1 Outcome: Adequate for Discharge   Problem: Acute Rehab PT Goals(only PT should resolve) Goal: Pt Will Go Supine/Side To Sit Outcome: Adequate for Discharge Goal: Pt Will Transfer Bed To Chair/Chair To Bed Outcome: Adequate for Discharge Goal: Pt Will Perform Standing Balance Or Pre-Gait Outcome: Adequate for Discharge Goal: Pt Will Ambulate Outcome: Adequate for Discharge Goal: Pt Will Go Up/Down Stairs Outcome: Adequate for Discharge

## 2021-04-05 NOTE — Progress Notes (Signed)
Pt d/c'd via wheelchair with belongings and with his family member.

## 2021-04-07 NOTE — Anesthesia Preprocedure Evaluation (Addendum)
Anesthesia Evaluation  Patient identified by MRN, date of birth, ID band Patient awake    Reviewed: Allergy & Precautions, NPO status , Patient's Chart, lab work & pertinent test results  History of Anesthesia Complications Negative for: history of anesthetic complications  Airway   TM Distance: >3 FB Neck ROM: Full  Mouth opening: Limited Mouth Opening  Dental no notable dental hx.    Pulmonary former smoker,    Pulmonary exam normal        Cardiovascular negative cardio ROS Normal cardiovascular exam     Neuro/Psych negative neurological ROS  negative psych ROS   GI/Hepatic Neg liver ROS, GERD  Medicated and Controlled,  Endo/Other  negative endocrine ROS  Renal/GU negative Renal ROS  negative genitourinary   Musculoskeletal Facial fractures   Abdominal   Peds  Hematology negative hematology ROS (+)   Anesthesia Other Findings Day of surgery medications reviewed with patient.  Reproductive/Obstetrics negative OB ROS                            Anesthesia Physical Anesthesia Plan  ASA: II  Anesthesia Plan: General   Post-op Pain Management:    Induction: Intravenous  PONV Risk Score and Plan: 3 and Treatment may vary due to age or medical condition, Ondansetron, Dexamethasone and Midazolam  Airway Management Planned: Nasal ETT  Additional Equipment: None  Intra-op Plan:   Post-operative Plan: Extubation in OR  Informed Consent: I have reviewed the patients History and Physical, chart, labs and discussed the procedure including the risks, benefits and alternatives for the proposed anesthesia with the patient or authorized representative who has indicated his/her understanding and acceptance.     Dental advisory given  Plan Discussed with: CRNA  Anesthesia Plan Comments:       Anesthesia Quick Evaluation

## 2021-04-08 ENCOUNTER — Ambulatory Visit (HOSPITAL_BASED_OUTPATIENT_CLINIC_OR_DEPARTMENT_OTHER)
Admission: RE | Admit: 2021-04-08 | Discharge: 2021-04-08 | Disposition: A | Discharge status: Home / Self Care | Payer: No Typology Code available for payment source | Attending: Plastic Surgery | Admitting: Plastic Surgery

## 2021-04-08 ENCOUNTER — Other Ambulatory Visit: Payer: Self-pay | Admitting: Surgical

## 2021-04-08 ENCOUNTER — Encounter (HOSPITAL_BASED_OUTPATIENT_CLINIC_OR_DEPARTMENT_OTHER): Payer: Self-pay | Admitting: Plastic Surgery

## 2021-04-08 ENCOUNTER — Telehealth: Payer: Self-pay

## 2021-04-08 ENCOUNTER — Encounter (HOSPITAL_BASED_OUTPATIENT_CLINIC_OR_DEPARTMENT_OTHER)
Admission: RE | Disposition: A | Discharge status: Home / Self Care | Payer: Self-pay | Source: Home / Self Care | Attending: Plastic Surgery

## 2021-04-08 ENCOUNTER — Ambulatory Visit (HOSPITAL_BASED_OUTPATIENT_CLINIC_OR_DEPARTMENT_OTHER): Payer: No Typology Code available for payment source | Admitting: Anesthesiology

## 2021-04-08 ENCOUNTER — Other Ambulatory Visit: Payer: Self-pay

## 2021-04-08 DIAGNOSIS — S02609A Fracture of mandible, unspecified, initial encounter for closed fracture: Secondary | ICD-10-CM | POA: Diagnosis present

## 2021-04-08 DIAGNOSIS — S0081XA Abrasion of other part of head, initial encounter: Secondary | ICD-10-CM | POA: Diagnosis not present

## 2021-04-08 DIAGNOSIS — W1789XA Other fall from one level to another, initial encounter: Secondary | ICD-10-CM | POA: Diagnosis not present

## 2021-04-08 DIAGNOSIS — Z880 Allergy status to penicillin: Secondary | ICD-10-CM | POA: Diagnosis not present

## 2021-04-08 DIAGNOSIS — Y9389 Activity, other specified: Secondary | ICD-10-CM | POA: Diagnosis not present

## 2021-04-08 DIAGNOSIS — Z87891 Personal history of nicotine dependence: Secondary | ICD-10-CM | POA: Diagnosis not present

## 2021-04-08 DIAGNOSIS — K219 Gastro-esophageal reflux disease without esophagitis: Secondary | ICD-10-CM | POA: Insufficient documentation

## 2021-04-08 DIAGNOSIS — S02642A Fracture of ramus of left mandible, initial encounter for closed fracture: Secondary | ICD-10-CM | POA: Insufficient documentation

## 2021-04-08 DIAGNOSIS — Z79899 Other long term (current) drug therapy: Secondary | ICD-10-CM | POA: Insufficient documentation

## 2021-04-08 HISTORY — PX: CLOSED REDUCTION MANDIBLE WITH MANDIBULOMA: SHX5313

## 2021-04-08 SURGERY — CLOSED REDUCTION, MANDIBLE, WITH ARCH BAR APPLICATION AND INTERMAXILLARY FIXATION
Anesthesia: General | Site: Mouth | Laterality: Left

## 2021-04-08 MED ORDER — BUPIVACAINE HCL (PF) 0.25 % IJ SOLN
INTRAMUSCULAR | Status: AC
  Filled 2021-04-08: qty 60

## 2021-04-08 MED ORDER — ACETAMINOPHEN 500 MG PO TABS
ORAL_TABLET | ORAL | Status: AC
  Filled 2021-04-08: qty 2

## 2021-04-08 MED ORDER — CHLORHEXIDINE GLUCONATE 0.12 % MT SOLN: 15.0000 mL | Freq: Two times a day (BID) | OROMUCOSAL | 0 refills | Status: DC

## 2021-04-08 MED ORDER — MIDAZOLAM HCL 2 MG/2ML IJ SOLN
INTRAMUSCULAR | Status: AC
  Filled 2021-04-08: qty 2

## 2021-04-08 MED ORDER — LIDOCAINE 2% (20 MG/ML) 5 ML SYRINGE
INTRAMUSCULAR | Status: AC
  Filled 2021-04-08: qty 5

## 2021-04-08 MED ORDER — PROMETHAZINE HCL 25 MG/ML IJ SOLN: 6.2500 mg | INTRAMUSCULAR | Status: DC | PRN

## 2021-04-08 MED ORDER — MORPHINE SULFATE 20 MG/5ML PO SOLN: 2.5000 mg | ORAL | 0 refills | Status: DC | PRN

## 2021-04-08 MED ORDER — CLINDAMYCIN PHOSPHATE 900 MG/50ML IV SOLN
900.0000 mg | INTRAVENOUS | Status: AC
  Administered 2021-04-08: 900 mg via INTRAVENOUS

## 2021-04-08 MED ORDER — CLINDAMYCIN PHOSPHATE 900 MG/50ML IV SOLN
INTRAVENOUS | Status: AC
  Filled 2021-04-08: qty 50

## 2021-04-08 MED ORDER — GLYCOPYRROLATE PF 0.2 MG/ML IJ SOSY
PREFILLED_SYRINGE | INTRAMUSCULAR | Status: DC | PRN
  Administered 2021-04-08: .2 mg via INTRAVENOUS

## 2021-04-08 MED ORDER — FENTANYL CITRATE (PF) 100 MCG/2ML IJ SOLN
INTRAMUSCULAR | Status: AC
  Filled 2021-04-08: qty 2

## 2021-04-08 MED ORDER — OXYCODONE HCL 5 MG PO TABS
5.0000 mg | ORAL_TABLET | Freq: Once | ORAL | Status: DC | PRN
Start: 2021-04-08 — End: 2021-04-08

## 2021-04-08 MED ORDER — SUGAMMADEX SODIUM 200 MG/2ML IV SOLN
INTRAVENOUS | Status: DC | PRN
  Administered 2021-04-08: 180 mg via INTRAVENOUS

## 2021-04-08 MED ORDER — DEXAMETHASONE SODIUM PHOSPHATE 10 MG/ML IJ SOLN
INTRAMUSCULAR | Status: DC | PRN
  Administered 2021-04-08: 10 mg via INTRAVENOUS

## 2021-04-08 MED ORDER — ONDANSETRON HCL 4 MG/2ML IJ SOLN
INTRAMUSCULAR | Status: DC | PRN
  Administered 2021-04-08: 4 mg via INTRAVENOUS

## 2021-04-08 MED ORDER — ACETAMINOPHEN 500 MG PO TABS
1000.0000 mg | ORAL_TABLET | Freq: Once | ORAL | Status: AC
  Administered 2021-04-08: 1000 mg via ORAL

## 2021-04-08 MED ORDER — MORPHINE SULFATE 20 MG/5ML PO SOLN: 2.4000 mg | ORAL | 0 refills | Status: DC | PRN

## 2021-04-08 MED ORDER — LIDOCAINE-EPINEPHRINE 1 %-1:100000 IJ SOLN
INTRAMUSCULAR | Status: DC | PRN
  Administered 2021-04-08: 20 mL

## 2021-04-08 MED ORDER — OXYMETAZOLINE HCL 0.05 % NA SOLN
NASAL | Status: AC
  Filled 2021-04-08: qty 30

## 2021-04-08 MED ORDER — MIDAZOLAM HCL 5 MG/5ML IJ SOLN
INTRAMUSCULAR | Status: DC | PRN
  Administered 2021-04-08: 2 mg via INTRAVENOUS

## 2021-04-08 MED ORDER — ROCURONIUM BROMIDE 10 MG/ML (PF) SYRINGE
PREFILLED_SYRINGE | INTRAVENOUS | Status: DC | PRN
  Administered 2021-04-08: 50 mg via INTRAVENOUS

## 2021-04-08 MED ORDER — PROPOFOL 10 MG/ML IV BOLUS
INTRAVENOUS | Status: DC | PRN
  Administered 2021-04-08: 160 mg via INTRAVENOUS

## 2021-04-08 MED ORDER — ROCURONIUM BROMIDE 10 MG/ML (PF) SYRINGE
PREFILLED_SYRINGE | INTRAVENOUS | Status: AC
  Filled 2021-04-08: qty 30

## 2021-04-08 MED ORDER — PROPOFOL 10 MG/ML IV BOLUS
INTRAVENOUS | Status: AC
  Filled 2021-04-08: qty 20

## 2021-04-08 MED ORDER — EPINEPHRINE PF 1 MG/ML IJ SOLN
INTRAMUSCULAR | Status: AC
  Filled 2021-04-08: qty 2

## 2021-04-08 MED ORDER — FENTANYL CITRATE (PF) 250 MCG/5ML IJ SOLN
INTRAMUSCULAR | Status: DC | PRN
  Administered 2021-04-08 (×2): 50 ug via INTRAVENOUS

## 2021-04-08 MED ORDER — LIDOCAINE-EPINEPHRINE 1 %-1:100000 IJ SOLN
INTRAMUSCULAR | Status: AC
  Filled 2021-04-08: qty 1

## 2021-04-08 MED ORDER — DEXMEDETOMIDINE (PRECEDEX) IN NS 20 MCG/5ML (4 MCG/ML) IV SYRINGE
PREFILLED_SYRINGE | INTRAVENOUS | Status: DC | PRN
  Administered 2021-04-08 (×2): 8 ug via INTRAVENOUS
  Administered 2021-04-08: 4 ug via INTRAVENOUS

## 2021-04-08 MED ORDER — FENTANYL CITRATE (PF) 100 MCG/2ML IJ SOLN
25.0000 ug | INTRAMUSCULAR | Status: DC | PRN
  Administered 2021-04-08: 25 ug via INTRAVENOUS
  Administered 2021-04-08: 50 ug via INTRAVENOUS

## 2021-04-08 MED ORDER — LIDOCAINE 2% (20 MG/ML) 5 ML SYRINGE
INTRAMUSCULAR | Status: DC | PRN
  Administered 2021-04-08: 100 mg via INTRAVENOUS

## 2021-04-08 MED ORDER — OXYCODONE HCL 5 MG/5ML PO SOLN: 5.0000 mg | Freq: Once | ORAL | Status: DC | PRN

## 2021-04-08 MED ORDER — LACTATED RINGERS IV SOLN: INTRAVENOUS | Status: DC

## 2021-04-08 MED ORDER — DEXAMETHASONE SODIUM PHOSPHATE 10 MG/ML IJ SOLN
INTRAMUSCULAR | Status: AC
  Filled 2021-04-08: qty 1

## 2021-04-08 SURGICAL SUPPLY — 66 items
ADH SKN CLS APL DERMABOND .7 (GAUZE/BANDAGES/DRESSINGS)
BLADE SURG 15 STRL LF DISP TIS (BLADE) ×2 IMPLANT
BLADE SURG 15 STRL SS (BLADE) ×3
CANISTER SUCT 1200ML W/VALVE (MISCELLANEOUS) ×3 IMPLANT
CATH ROBINSON RED A/P 16FR (CATHETERS) ×3 IMPLANT
CLEANER CAUTERY TIP 5X5 PAD (MISCELLANEOUS) IMPLANT
COVER MAYO STAND STRL (DRAPES) IMPLANT
COVER WAND RF STERILE (DRAPES) IMPLANT
DECANTER SPIKE VIAL GLASS SM (MISCELLANEOUS) IMPLANT
DEPRESSOR TONGUE BLADE STERILE (MISCELLANEOUS) IMPLANT
DERMABOND ADVANCED (GAUZE/BANDAGES/DRESSINGS)
DERMABOND ADVANCED .7 DNX12 (GAUZE/BANDAGES/DRESSINGS) IMPLANT
DRAPE UTILITY XL STRL (DRAPES) IMPLANT
ELECT COATED BLADE 2.86 ST (ELECTRODE) IMPLANT
ELECT NEEDLE BLADE 2-5/6 (NEEDLE) ×3 IMPLANT
ELECT REM PT RETURN 9FT ADLT (ELECTROSURGICAL) ×3
ELECTRODE REM PT RTRN 9FT ADLT (ELECTROSURGICAL) ×2 IMPLANT
GAUZE PACKING FOLDED 2  STR (GAUZE/BANDAGES/DRESSINGS)
GAUZE PACKING FOLDED 2 STR (GAUZE/BANDAGES/DRESSINGS) IMPLANT
GAUZE VASELINE FOILPK 1/2 X 72 (GAUZE/BANDAGES/DRESSINGS) IMPLANT
GLOVE SRG 8 PF TXTR STRL LF DI (GLOVE) ×2 IMPLANT
GLOVE SURG ENC MOIS LTX SZ6.5 (GLOVE) ×3 IMPLANT
GLOVE SURG ENC MOIS LTX SZ7.5 (GLOVE) IMPLANT
GLOVE SURG ENC TEXT LTX SZ7.5 (GLOVE) ×3 IMPLANT
GLOVE SURG LTX SZ8 (GLOVE) ×3 IMPLANT
GLOVE SURG UNDER POLY LF SZ8 (GLOVE) ×3
GOWN STRL REUS W/ TWL LRG LVL3 (GOWN DISPOSABLE) ×4 IMPLANT
GOWN STRL REUS W/TWL 2XL LVL3 (GOWN DISPOSABLE) ×3 IMPLANT
GOWN STRL REUS W/TWL LRG LVL3 (GOWN DISPOSABLE) ×6
MARKER SKIN DUAL TIP RULER LAB (MISCELLANEOUS) IMPLANT
NEEDLE BLUNT 17GA (NEEDLE) IMPLANT
NEEDLE PRECISIONGLIDE 27X1.5 (NEEDLE) ×3 IMPLANT
NS IRRIG 1000ML POUR BTL (IV SOLUTION) ×3 IMPLANT
PACK BASIN DAY SURGERY FS (CUSTOM PROCEDURE TRAY) ×3 IMPLANT
PACK ENT DAY SURGERY (CUSTOM PROCEDURE TRAY) ×3 IMPLANT
PAD CLEANER CAUTERY TIP 5X5 (MISCELLANEOUS)
PATTIES SURGICAL .5 X3 (DISPOSABLE) IMPLANT
PENCIL SMOKE EVACUATOR (MISCELLANEOUS) ×3 IMPLANT
PLATE SMARTLOCK MANDIBULAR FX (Plate) ×6 IMPLANT
SCISSORS WIRE ANG 4 3/4 DISP (INSTRUMENTS) IMPLANT
SCREW LOCK SELFDRIL 2.0X8M MMF (Screw) ×15 IMPLANT
SCREW LOCKING SELF DRILL 2.0X6 (Screw) ×15 IMPLANT
SET WALTER ACTIVATION W/DRAPE (SET/KITS/TRAYS/PACK) IMPLANT
SHEET MEDIUM DRAPE 40X70 STRL (DRAPES) IMPLANT
SHEILD EYE MED CORNL SHD 22X21 (OPHTHALMIC RELATED)
SHIELD EYE MED CORNL SHD 22X21 (OPHTHALMIC RELATED) IMPLANT
SLEEVE SCD COMPRESS KNEE MED (STOCKING) ×3 IMPLANT
SUCTION FRAZIER HANDLE 10FR (MISCELLANEOUS) ×3
SUCTION TUBE FRAZIER 10FR DISP (MISCELLANEOUS) ×2 IMPLANT
SUT CHROMIC 3 0 PS 2 (SUTURE) IMPLANT
SUT CHROMIC 4 0 PS 2 18 (SUTURE) IMPLANT
SUT STEEL 0 (SUTURE)
SUT STEEL 0 18XMFL TIE 17 (SUTURE) IMPLANT
SUT STEEL 1 (SUTURE) IMPLANT
SUT STEEL 2 (SUTURE) IMPLANT
SUT VIC AB 3-0 FS2 27 (SUTURE) IMPLANT
SUT VICRYL 4-0 PS2 18IN ABS (SUTURE) IMPLANT
SYR BULB EAR ULCER 3OZ GRN STR (SYRINGE) ×3 IMPLANT
SYR CONTROL 10ML LL (SYRINGE) IMPLANT
TOOTHBRUSH ADULT (PERSONAL CARE ITEMS) IMPLANT
TOWEL GREEN STERILE FF (TOWEL DISPOSABLE) ×3 IMPLANT
TRAY DSU PREP LF (CUSTOM PROCEDURE TRAY) IMPLANT
TRAY MODULE STERIL MMF REUSE (Plate) ×4 IMPLANT
TUBE CONNECTING 20X1/4 (TUBING) IMPLANT
TUBE SALEM SUMP 16 FR W/ARV (TUBING) IMPLANT
YANKAUER SUCT BULB TIP NO VENT (SUCTIONS) IMPLANT

## 2021-04-08 NOTE — Anesthesia Procedure Notes (Signed)
Procedure Name: Intubation Date/Time: 04/08/2021 9:11 AM Performed by: Lucinda Dell, CRNA Pre-anesthesia Checklist: Patient identified, Emergency Drugs available, Suction available and Patient being monitored Patient Re-evaluated:Patient Re-evaluated prior to induction Oxygen Delivery Method: Circle system utilized Preoxygenation: Pre-oxygenation with 100% oxygen Induction Type: IV induction Ventilation: Mask ventilation without difficulty Laryngoscope Size: Glidescope and 3 Nasal Tubes: Right, Magill forceps- large, utilized and Nasal prep performed Number of attempts: 1 Airway Equipment and Method: Video-laryngoscopy Placement Confirmation: ETT inserted through vocal cords under direct vision,  positive ETCO2 and breath sounds checked- equal and bilateral Tube secured with: Tape Dental Injury: Teeth and Oropharynx as per pre-operative assessment

## 2021-04-08 NOTE — Discharge Instructions (Signed)
Activity: As tolerated, but avoid strenuous activity until follow up visit.  Diet: Liquid diet  Wound Care: Can apply dental wax as needed for comfort.  Would rinse the mouth twice a day with chlorhexidine rinse.  Special Instructions:  Call our office if any unusual problems occur such as pain, excessive bleeding, unrelieved nausea/vomiting, fever &/or chills.  Follow-up appointment: Scheduled for next week.   May take Tylenol after 2pm, if needed.    Post Anesthesia Home Care Instructions  Activity: Get plenty of rest for the remainder of the day. A responsible individual must stay with you for 24 hours following the procedure.  For the next 24 hours, DO NOT: -Drive a car -Advertising copywriter -Drink alcoholic beverages -Take any medication unless instructed by your physician -Make any legal decisions or sign important papers.  Meals: Start with liquid foods such as gelatin or soup. Progress to regular foods as tolerated. Avoid greasy, spicy, heavy foods. If nausea and/or vomiting occur, drink only clear liquids until the nausea and/or vomiting subsides. Call your physician if vomiting continues.  Special Instructions/Symptoms: Your throat may feel dry or sore from the anesthesia or the breathing tube placed in your throat during surgery. If this causes discomfort, gargle with warm salt water. The discomfort should disappear within 24 hours.  If you had a scopolamine patch placed behind your ear for the management of post- operative nausea and/or vomiting:  1. The medication in the patch is effective for 72 hours, after which it should be removed.  Wrap patch in a tissue and discard in the trash. Wash hands thoroughly with soap and water. 2. You may remove the patch earlier than 72 hours if you experience unpleasant side effects which may include dry mouth, dizziness or visual disturbances. 3. Avoid touching the patch. Wash your hands with soap and water after contact with the  patch.

## 2021-04-08 NOTE — Brief Op Note (Signed)
04/08/2021  9:56 AM  PATIENT:  Chris Stanley  45 y.o. male  PRE-OPERATIVE DIAGNOSIS:  facial fractures  POST-OPERATIVE DIAGNOSIS:  facial fractures  PROCEDURE:  Procedure(s): closed reduction and placement of maxillomandibular fixation for left mandibular fracture (Left)  SURGEON:  Surgeon(s) and Role:    * Chrishawna Farina, Wendy Poet, MD - Primary  PHYSICIAN ASSISTANT: Enedina Finner, RNFA  ASSISTANTS: none   ANESTHESIA:   general  EBL:  5   BLOOD ADMINISTERED:none  DRAINS: none   LOCAL MEDICATIONS USED:  XYLOCAINE   SPECIMEN:  No Specimen  DISPOSITION OF SPECIMEN:  N/A  COUNTS:  YES  TOURNIQUET:  * No tourniquets in log *  DICTATION: .Dragon Dictation  PLAN OF CARE: Discharge to home after PACU  PATIENT DISPOSITION:  PACU - hemodynamically stable.   Delay start of Pharmacological VTE agent (>24hrs) due to surgical blood loss or risk of bleeding: not applicable

## 2021-04-08 NOTE — Telephone Encounter (Signed)
Katharina Caper called on behalf of patient who had surgery today and his mouth is wired shut.  Corrie Dandy said that we sent patient's prescription for morphine to [facility].  She spoke with [facility] and they said that they do not carry this medication.  Mary asked that we please send this prescription to [facility] at [phone] [address], [place], which will be closer to where the patient will be staying.

## 2021-04-08 NOTE — Transfer of Care (Signed)
Immediate Anesthesia Transfer of Care Note  Patient: Chris Stanley  Procedure(s) Performed: closed reduction and placement of maxillomandibular fixation for left mandibular fracture (Bilateral Mouth)  Patient Location: PACU  Anesthesia Type:General  Level of Consciousness: sedated, patient cooperative and responds to stimulation  Airway & Oxygen Therapy: Patient Spontanous Breathing and Patient connected to face mask oxygen  Post-op Assessment: Report given to RN and Post -op Vital signs reviewed and stable  Post vital signs: Reviewed and stable  Last Vitals:  Vitals Value Taken Time  BP 133/79 04/08/21 1008  Temp    Pulse 89 04/08/21 1014  Resp 14 04/08/21 1014  SpO2 100 % 04/08/21 1014  Vitals shown include unvalidated device data.  Last Pain:  Vitals:   04/08/21 0808  TempSrc: Oral  PainSc: 3          Complications: No complications documented.

## 2021-04-08 NOTE — Progress Notes (Signed)
Patient's family called stated that patient had surgery today and they were prescribed morphine as he had his mouth wired shut today by Dr. Arita Miss for facial fracture.  The pharmacy that the morphine was sent to does not carry this medication and they asked if we would send it to a different location.  I have reviewed Dr. Thomos Lemons medication order for morphine and will send the same dose and to the pharmacy.  I have also [facility] and confirmed that patient did not pick this up and canceled the prescription.  PDMP reviewed.

## 2021-04-08 NOTE — Anesthesia Postprocedure Evaluation (Signed)
Anesthesia Post Note  Patient: Anand Tejada Omalley  Procedure(s) Performed: closed reduction and placement of maxillomandibular fixation for left mandibular fracture (Bilateral Mouth)     Patient location during evaluation: PACU Anesthesia Type: General Level of consciousness: awake and alert and oriented Pain management: pain level controlled Vital Signs Assessment: post-procedure vital signs reviewed and stable Respiratory status: spontaneous breathing, nonlabored ventilation and respiratory function stable Cardiovascular status: blood pressure returned to baseline Postop Assessment: no apparent nausea or vomiting Anesthetic complications: no   No complications documented.  Last Vitals:  Vitals:   04/08/21 1045 04/08/21 1052  BP: (!) 139/95 (!) 131/91  Pulse: 96 93  Resp: 18 15  Temp:    SpO2: 98% 99%    Last Pain:  Vitals:   04/08/21 1052  TempSrc:   PainSc: Asleep                 Kaylyn Layer

## 2021-04-08 NOTE — Interval H&P Note (Signed)
History and Physical Interval Note:  04/08/2021 8:24 AM  Chris Stanley  has presented today for surgery, with the diagnosis of facial fractures.  The various methods of treatment have been discussed with the patient and family. After consideration of risks, benefits and other options for treatment, the patient has consented to  Procedure(s): closed reduction and placement of maxillomandibular fixation for left mandibular fracture (Left) OPEN REDUCTION INTERNAL FIXATION (ORIF) MANDIBULAR FRACTURE (Left) as a surgical intervention.  The patient's history has been reviewed, patient examined, no change in status, stable for surgery.  I have reviewed the patient's chart and labs.  Questions were answered to the patient's satisfaction.     [name]
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
## 2021-04-08 NOTE — Op Note (Signed)
Operative Note   DATE OF OPERATION: 04/08/2021  SURGICAL DEPARTMENT: Plastic Surgery  PREOPERATIVE DIAGNOSES: Left minimally displaced posterior ramus mandibular fracture  POSTOPERATIVE DIAGNOSES:  same  PROCEDURE: Closed reduction and placement of maxillomandibular fixation for mandibular fracture  SURGEON: Ancil Linsey, MD  ASSISTANT: Enedina Finner, RNFA The advanced practice practitioner (APP) assisted throughout the case.  The APP was essential in retraction and counter traction when needed to make the case progress smoothly.  This retraction and assistance made it possible to see the tissue plans for the procedure.  The assistance was needed for blood control, tissue re-approximation and assisted with closure of the incision site.  ANESTHESIA:  General.   COMPLICATIONS: None.   INDICATIONS FOR PROCEDURE:  The patient, Chris Stanley is a 45 y.o. male born on 1976-07-23, is here for treatment of mandibular fracture MRN: [id number]  CONSENT:  Informed consent was obtained directly from the patient. Risks, benefits and alternatives were fully discussed. Specific risks including but not limited to bleeding, infection, hematoma, seroma, scarring, pain, contracture, asymmetry, wound healing problems, and need for further surgery were all discussed. The patient did have an ample opportunity to have questions answered to satisfaction.   DESCRIPTION OF PROCEDURE:  The patient was taken to the operating room. SCDs were placed and antibiotics were given.  General anesthesia was administered.  The patient's operative site was prepped and draped in a sterile fashion. A time out was performed and all information was confirmed to be correct.  I started by infiltrating lidocaine with epinephrine into his gingiva.  I then used the Stryker hybrid arch bar set to apply arch bars to the maxilla and mandible.  5 6 mm screws were used for the maxilla and 5 8 mm screws were used for the  mandible.  I took care to place the screws in between the tooth roots.  I was able to get a suitable closed reduction and placed 10 and occlusion based on his wear facets.  Rubber bands were then used to hold the maxillomandibular fixation.  He tolerated this well.  The patient tolerated the procedure well.  There were no complications. The patient was allowed to wake from anesthesia, extubated and taken to the recovery room in satisfactory condition.

## 2021-04-11 ENCOUNTER — Encounter (HOSPITAL_BASED_OUTPATIENT_CLINIC_OR_DEPARTMENT_OTHER): Payer: Self-pay | Admitting: Plastic Surgery

## 2021-04-11 NOTE — Telephone Encounter (Signed)
Per Matthew,PA-C on (04-08-21)-Patient's family called stated that patient had surgery today and they were prescribed morphine as he had his mouth wired shut today by Dr. Arita Miss for facial fracture.  The pharmacy that the morphine was sent to does not carry this medication and they asked if we would send it to a different location.  I have reviewed Dr. Thomos Lemons medication order for morphine and will send the same dose and to the pharmacy.  I have also [facility] and confirmed that patient did not pick this up and canceled the prescription.  PDMP reviewed.

## 2021-04-13 ENCOUNTER — Encounter: Payer: Self-pay | Admitting: Plastic Surgery

## 2021-04-21 ENCOUNTER — Other Ambulatory Visit: Payer: Self-pay

## 2021-04-21 ENCOUNTER — Encounter: Payer: Worker's Compensation | Admitting: Plastic Surgery

## 2021-04-21 ENCOUNTER — Ambulatory Visit (INDEPENDENT_AMBULATORY_CARE_PROVIDER_SITE_OTHER): Payer: Worker's Compensation | Admitting: Plastic Surgery

## 2021-04-21 DIAGNOSIS — S02642D Fracture of ramus of left mandible, subsequent encounter for fracture with routine healing: Secondary | ICD-10-CM

## 2021-04-21 MED ORDER — METHOCARBAMOL 500 MG PO TABS: 500.0000 mg | ORAL_TABLET | Freq: Three times a day (TID) | ORAL | 0 refills | Status: DC | PRN

## 2021-04-21 NOTE — Progress Notes (Signed)
Patient presents about 2 weeks postop from closed reduction and MMF placement for a left-sided mandible fracture.  He feels like things are going well and his fixation has been maintained.  Everything looks to be doing well on intraoral inspection.  He did point out a prominence along his infraorbital rim that he is able to palpate with his finger.  This feels to be a fracture site of his infraorbital rim as a component of his ZMC fracture on that side.  This was very minimally displaced and so we elected to treat that nonoperatively.  We will plan to maintain the MMF for another 2 weeks and plan to have the arch bars removed around that time.  All of his questions were answered.  He did request Robaxin for jaw muscle tightness which I think is reasonable for him.

## 2021-04-21 NOTE — H&P (View-Only) (Signed)
Patient presents about 2 weeks postop from closed reduction and MMF placement for a left-sided mandible fracture.  He feels like things are going well and his fixation has been maintained.  Everything looks to be doing well on intraoral inspection.  He did point out a prominence along his infraorbital rim that he is able to palpate with his finger.  This feels to be a fracture site of his infraorbital rim as a component of his ZMC fracture on that side.  This was very minimally displaced and so we elected to treat that nonoperatively.  We will plan to maintain the MMF for another 2 weeks and plan to have the arch bars removed around that time.  All of his questions were answered.  He did request Robaxin for jaw muscle tightness which I think is reasonable for him. 

## 2021-04-29 ENCOUNTER — Telehealth: Payer: Self-pay | Admitting: Plastic Surgery

## 2021-04-29 NOTE — Telephone Encounter (Signed)
Called and spoke with the patient regarding the message below.  Informed the patient that I spoke with Dr. Arita Miss and he stated that he's fine.   Patient verbalized understanding and agreed.  Patient stated that if another rubber band snaps he will give [place] a call back.//AB/CMA

## 2021-04-29 NOTE — Telephone Encounter (Signed)
Patient called to say he was using the waterpick because something was on one of the rubber bands. He touched it with is finger and the rubber band snapped. He wants to make sure this will not cause the whole thing to come undone. Please call to advise what he should do or if that one rubber band being off will affect anything.

## 2021-05-16 ENCOUNTER — Encounter (HOSPITAL_COMMUNITY): Payer: Self-pay | Admitting: Plastic Surgery

## 2021-05-16 ENCOUNTER — Other Ambulatory Visit: Payer: Self-pay

## 2021-05-16 NOTE — Progress Notes (Signed)
Chris Stanley denies chest pain or shortness of breath. Patient denies s/s of Covid in his home and has not been exposed to anyone with it to his knowledge.

## 2021-05-17 ENCOUNTER — Ambulatory Visit (HOSPITAL_COMMUNITY): Payer: Worker's Compensation | Admitting: Certified Registered"

## 2021-05-17 ENCOUNTER — Other Ambulatory Visit: Payer: Self-pay

## 2021-05-17 ENCOUNTER — Ambulatory Visit (HOSPITAL_COMMUNITY)
Admission: RE | Admit: 2021-05-17 | Discharge: 2021-05-17 | Disposition: A | Discharge status: Home / Self Care | Payer: Worker's Compensation | Attending: Plastic Surgery | Admitting: Plastic Surgery

## 2021-05-17 ENCOUNTER — Encounter (HOSPITAL_COMMUNITY)
Admission: RE | Disposition: A | Discharge status: Home / Self Care | Payer: Self-pay | Source: Home / Self Care | Attending: Plastic Surgery

## 2021-05-17 ENCOUNTER — Encounter (HOSPITAL_COMMUNITY): Payer: Self-pay | Admitting: Plastic Surgery

## 2021-05-17 DIAGNOSIS — Z472 Encounter for removal of internal fixation device: Secondary | ICD-10-CM | POA: Diagnosis present

## 2021-05-17 DIAGNOSIS — S02642D Fracture of ramus of left mandible, subsequent encounter for fracture with routine healing: Secondary | ICD-10-CM

## 2021-05-17 DIAGNOSIS — Z87891 Personal history of nicotine dependence: Secondary | ICD-10-CM | POA: Diagnosis not present

## 2021-05-17 DIAGNOSIS — Z880 Allergy status to penicillin: Secondary | ICD-10-CM | POA: Diagnosis not present

## 2021-05-17 DIAGNOSIS — Z79899 Other long term (current) drug therapy: Secondary | ICD-10-CM | POA: Diagnosis not present

## 2021-05-17 HISTORY — PX: MANDIBULAR HARDWARE REMOVAL: SHX5205

## 2021-05-17 HISTORY — DX: Depression, unspecified: F32.A

## 2021-05-17 HISTORY — DX: Anxiety disorder, unspecified: F41.9

## 2021-05-17 HISTORY — DX: Bipolar disorder, unspecified: F31.9

## 2021-05-17 SURGERY — REMOVAL, HARDWARE, MANDIBLE
Anesthesia: General | Site: Mouth

## 2021-05-17 MED ORDER — CHLORHEXIDINE GLUCONATE 0.12 % MT SOLN: 15.0000 mL | Freq: Once | OROMUCOSAL | Status: AC

## 2021-05-17 MED ORDER — FENTANYL CITRATE (PF) 100 MCG/2ML IJ SOLN
INTRAMUSCULAR | Status: DC | PRN
  Administered 2021-05-17 (×2): 50 ug via INTRAVENOUS

## 2021-05-17 MED ORDER — ONDANSETRON HCL 4 MG/2ML IJ SOLN
INTRAMUSCULAR | Status: AC
  Filled 2021-05-17: qty 2

## 2021-05-17 MED ORDER — PROPOFOL 10 MG/ML IV BOLUS
INTRAVENOUS | Status: DC | PRN
  Administered 2021-05-17: 20 mg via INTRAVENOUS
  Administered 2021-05-17: 10 mg via INTRAVENOUS
  Administered 2021-05-17: 30 mg via INTRAVENOUS
  Administered 2021-05-17: 10 mg via INTRAVENOUS
  Administered 2021-05-17 (×3): 20 mg via INTRAVENOUS

## 2021-05-17 MED ORDER — CLINDAMYCIN PHOSPHATE 900 MG/50ML IV SOLN
INTRAVENOUS | Status: AC
  Filled 2021-05-17: qty 50

## 2021-05-17 MED ORDER — PROPOFOL 10 MG/ML IV BOLUS
INTRAVENOUS | Status: AC
  Filled 2021-05-17: qty 20

## 2021-05-17 MED ORDER — ORAL CARE MOUTH RINSE: 15.0000 mL | Freq: Once | OROMUCOSAL | Status: AC

## 2021-05-17 MED ORDER — LACTATED RINGERS IV SOLN: INTRAVENOUS | Status: DC

## 2021-05-17 MED ORDER — LIDOCAINE-EPINEPHRINE 1 %-1:100000 IJ SOLN
INTRAMUSCULAR | Status: DC | PRN
  Administered 2021-05-17: 10 mL

## 2021-05-17 MED ORDER — LABETALOL HCL 5 MG/ML IV SOLN
INTRAVENOUS | Status: AC
  Filled 2021-05-17: qty 4

## 2021-05-17 MED ORDER — CLINDAMYCIN PHOSPHATE 900 MG/50ML IV SOLN
900.0000 mg | INTRAVENOUS | Status: AC
  Administered 2021-05-17: 900 mg via INTRAVENOUS

## 2021-05-17 MED ORDER — CHLORHEXIDINE GLUCONATE 0.12 % MT SOLN
OROMUCOSAL | Status: AC
  Administered 2021-05-17: 15 mL
  Filled 2021-05-17: qty 15

## 2021-05-17 MED ORDER — ONDANSETRON HCL 4 MG/2ML IJ SOLN
INTRAMUSCULAR | Status: DC | PRN
  Administered 2021-05-17: 4 mg via INTRAVENOUS

## 2021-05-17 MED ORDER — MIDAZOLAM HCL 2 MG/2ML IJ SOLN
INTRAMUSCULAR | Status: AC
  Filled 2021-05-17: qty 2

## 2021-05-17 MED ORDER — HYDROCODONE-ACETAMINOPHEN 5-325 MG PO TABS: 1.0000 | ORAL_TABLET | ORAL | 0 refills | Status: DC | PRN

## 2021-05-17 MED ORDER — OXYCODONE HCL 5 MG/5ML PO SOLN: 5.0000 mg | Freq: Once | ORAL | Status: DC | PRN

## 2021-05-17 MED ORDER — MIDAZOLAM HCL 2 MG/2ML IJ SOLN
INTRAMUSCULAR | Status: DC | PRN
  Administered 2021-05-17: 2 mg via INTRAVENOUS

## 2021-05-17 MED ORDER — LIDOCAINE-EPINEPHRINE 1 %-1:100000 IJ SOLN
INTRAMUSCULAR | Status: AC
  Filled 2021-05-17: qty 1

## 2021-05-17 MED ORDER — HYDROMORPHONE HCL 1 MG/ML IJ SOLN: 0.2500 mg | INTRAMUSCULAR | Status: DC | PRN

## 2021-05-17 MED ORDER — FENTANYL CITRATE (PF) 250 MCG/5ML IJ SOLN
INTRAMUSCULAR | Status: AC
  Filled 2021-05-17: qty 5

## 2021-05-17 MED ORDER — OXYCODONE HCL 5 MG PO TABS
5.0000 mg | ORAL_TABLET | Freq: Once | ORAL | Status: DC | PRN
Start: 2021-05-17 — End: 2021-05-17

## 2021-05-17 MED ORDER — PROMETHAZINE HCL 25 MG/ML IJ SOLN: 6.2500 mg | INTRAMUSCULAR | Status: DC | PRN

## 2021-05-17 SURGICAL SUPPLY — 29 items
BLADE SURG 15 STRL LF DISP TIS (BLADE) IMPLANT
BLADE SURG 15 STRL SS (BLADE)
CANISTER SUCT 1200ML W/VALVE (MISCELLANEOUS) ×3 IMPLANT
COVER SURGICAL LIGHT HANDLE (MISCELLANEOUS) ×3 IMPLANT
COVER WAND RF STERILE (DRAPES) IMPLANT
DECANTER SPIKE VIAL GLASS SM (MISCELLANEOUS) ×3 IMPLANT
DRAPE HALF SHEET 40X57 (DRAPES) IMPLANT
DRAPE UTILITY XL STRL (DRAPES) ×3 IMPLANT
ELECT COATED BLADE 2.86 ST (ELECTRODE) IMPLANT
ELECT REM PT RETURN 9FT ADLT (ELECTROSURGICAL)
ELECTRODE REM PT RTRN 9FT ADLT (ELECTROSURGICAL) IMPLANT
GLOVE SRG 8 PF TXTR STRL LF DI (GLOVE) ×1 IMPLANT
GLOVE SURG ENC MOIS LTX SZ7.5 (GLOVE) IMPLANT
GLOVE SURG ENC TEXT LTX SZ7.5 (GLOVE) ×3 IMPLANT
GLOVE SURG UNDER POLY LF SZ8 (GLOVE) ×3
GOWN STRL REUS W/ TWL LRG LVL3 (GOWN DISPOSABLE) ×2 IMPLANT
GOWN STRL REUS W/TWL LRG LVL3 (GOWN DISPOSABLE) ×6
KIT BASIN OR (CUSTOM PROCEDURE TRAY) ×3 IMPLANT
MARKER SKIN DUAL TIP RULER LAB (MISCELLANEOUS) IMPLANT
NEEDLE PRECISIONGLIDE 27X1.5 (NEEDLE) ×3 IMPLANT
PENCIL SMOKE EVACUATOR (MISCELLANEOUS) IMPLANT
SUCTION FRAZIER HANDLE 10FR (MISCELLANEOUS) ×3
SUCTION TUBE FRAZIER 10FR DISP (MISCELLANEOUS) ×1 IMPLANT
SUT CHROMIC 3 0 PS 2 (SUTURE) IMPLANT
SUT CHROMIC 4 0 PS 2 18 (SUTURE) IMPLANT
SYR CONTROL 10ML LL (SYRINGE) ×3 IMPLANT
TOWEL GREEN STERILE FF (TOWEL DISPOSABLE) ×3 IMPLANT
TRAY ENT MC OR (CUSTOM PROCEDURE TRAY) ×3 IMPLANT
YANKAUER SUCT BULB TIP NO VENT (SUCTIONS) ×3 IMPLANT

## 2021-05-17 NOTE — Interval H&P Note (Signed)
Patient seen an examined. Recent benefits discussed. Proceed with surgery.

## 2021-05-17 NOTE — Discharge Instructions (Addendum)
Activity: As tolerated.  Diet: Regular.  Can start with soft foods and advance as tolerated.  Wound Care: Saline oral rinse as needed  Special Instructions:  Call our office if any unusual problems occur such as pain, excessive bleeding, unrelieved nausea/vomiting, fever &/or chills.  Follow-up appointment: Scheduled for next week.

## 2021-05-17 NOTE — Brief Op Note (Signed)
05/17/2021  2:27 PM  PATIENT:  Chris Stanley  45 y.o. male  PRE-OPERATIVE DIAGNOSIS:  facial fracture  POST-OPERATIVE DIAGNOSIS:  facial fracture, retained hardware  PROCEDURE:  Procedure(s): Removal of mandibular hardware (N/A)  SURGEON:  Surgeon(s) and Role:    * Isaiyah Feldhaus, Wendy Poet, MD - Primary  PHYSICIAN ASSISTANT:   ASSISTANTS: Bonita Cox, RNFA   ANESTHESIA:   IV sedation  EBL:  2 mL   BLOOD ADMINISTERED:none  DRAINS: none   LOCAL MEDICATIONS USED:  LIDOCAINE   SPECIMEN:  No Specimen  DISPOSITION OF SPECIMEN:  N/A  COUNTS:  YES  TOURNIQUET:  * No tourniquets in log *  DICTATION: .Dragon Dictation  PLAN OF CARE: Discharge to home after PACU  PATIENT DISPOSITION:  PACU - hemodynamically stable.   Delay start of Pharmacological VTE agent (>24hrs) due to surgical blood loss or risk of bleeding: not applicable

## 2021-05-17 NOTE — Transfer of Care (Signed)
Immediate Anesthesia Transfer of Care Note  Patient: Chris Stanley  Procedure(s) Performed: Removal of mandibular hardware (Mouth)  Patient Location: PACU  Anesthesia Type:MAC  Level of Consciousness: awake, alert  and oriented  Airway & Oxygen Therapy: Patient Spontanous Breathing and Patient connected to nasal cannula oxygen  Post-op Assessment: Report given to RN and Post -op Vital signs reviewed and stable  Post vital signs: Reviewed and stable  Last Vitals:  Vitals Value Taken Time  BP 116/65 05/17/21 1428  Temp    Pulse 79 05/17/21 1433  Resp 9 05/17/21 1433  SpO2 100 % 05/17/21 1433  Vitals shown include unvalidated device data.  Last Pain:  Vitals:   05/17/21 1157  TempSrc:   PainSc: 0-No pain         Complications: No notable events documented.

## 2021-05-17 NOTE — Anesthesia Preprocedure Evaluation (Signed)
Anesthesia Evaluation  Patient identified by MRN, date of birth, ID band Patient awake    Reviewed: Allergy & Precautions, NPO status , Patient's Chart, lab work & pertinent test results  History of Anesthesia Complications Negative for: history of anesthetic complications  Airway   TM Distance: >3 FB Neck ROM: Full  Mouth opening: Limited Mouth Opening  Dental no notable dental hx.    Pulmonary former smoker,    Pulmonary exam normal        Cardiovascular negative cardio ROS Normal cardiovascular exam     Neuro/Psych Anxiety Depression Bipolar Disorder negative neurological ROS  negative psych ROS   GI/Hepatic Neg liver ROS, GERD  Medicated and Controlled,  Endo/Other  negative endocrine ROS  Renal/GU negative Renal ROS  negative genitourinary   Musculoskeletal Facial fractures   Abdominal   Peds  Hematology negative hematology ROS (+)   Anesthesia Other Findings Day of surgery medications reviewed with patient.  Reproductive/Obstetrics negative OB ROS                             Anesthesia Physical  Anesthesia Plan  ASA: II  Anesthesia Plan: General   Post-op Pain Management:    Induction: Intravenous  PONV Risk Score and Plan: 3 and Treatment may vary due to age or medical condition, Ondansetron, Dexamethasone and Midazolam  Airway Management Planned: Mask  Additional Equipment: None  Intra-op Plan:   Post-operative Plan:   Informed Consent: I have reviewed the patients History and Physical, chart, labs and discussed the procedure including the risks, benefits and alternatives for the proposed anesthesia with the patient or authorized representative who has indicated his/her understanding and acceptance.     Dental advisory given  Plan Discussed with: CRNA  Anesthesia Plan Comments:         Anesthesia Quick Evaluation

## 2021-05-17 NOTE — Op Note (Signed)
Operative Note   DATE OF OPERATION: 05/17/2021  SURGICAL DEPARTMENT: Plastic Surgery  PREOPERATIVE DIAGNOSES: Mandibular fracture status post MMF fixation  POSTOPERATIVE DIAGNOSES:  same  PROCEDURE: Removal of mandibular arch bars  SURGEON: Ancil Linsey, MD  ASSISTANT: Enedina Finner, RNFA The advanced practice practitioner (APP) assisted throughout the case.  The APP was essential in retraction and counter traction when needed to make the case progress smoothly.  This retraction and assistance made it possible to see the tissue plans for the procedure.  The assistance was needed for blood control, tissue re-approximation and assisted with closure of the incision site.  ANESTHESIA:  General.   COMPLICATIONS: None.   INDICATIONS FOR PROCEDURE:  The patient, Chris Stanley is a 45 y.o. male born on 13-Feb-1976, is here for treatment of hardware after completion of treatment for mandibular fracture MRN: [id number]  CONSENT:  Informed consent was obtained directly from the patient. Risks, benefits and alternatives were fully discussed. Specific risks including but not limited to bleeding, infection, hematoma, seroma, scarring, pain, contracture, asymmetry, wound healing problems, and need for further surgery were all discussed. The patient did have an ample opportunity to have questions answered to satisfaction.   DESCRIPTION OF PROCEDURE:  The patient was taken to the operating room. SCDs were placed and antibiotics were given.  IV anesthesia was administered.  The patient's operative site was prepped and draped in a sterile fashion. A time out was performed and all information was confirmed to be correct.  Start by infiltrating lidocaine with epinephrine throughout the gingiva.  This was given time to work.  A 2 lip retractor was placed.  5 maxillary screws and 5 mandibular screws were removed.  The hardware and arch bars then came off without any issue.  There is no signs of any  other issues or gross malocclusion.  He tolerated the procedure well.  The patient tolerated the procedure well.  There were no complications. The patient was allowed to wake from anesthesia, extubated and taken to the recovery room in satisfactory condition.

## 2021-05-18 ENCOUNTER — Encounter (HOSPITAL_COMMUNITY): Payer: Self-pay | Admitting: Plastic Surgery

## 2021-05-18 NOTE — Anesthesia Postprocedure Evaluation (Signed)
Anesthesia Post Note  Patient: Chris Stanley  Procedure(s) Performed: Removal of mandibular hardware (Mouth)     Patient location during evaluation: PACU Anesthesia Type: General Level of consciousness: awake and alert Pain management: pain level controlled Vital Signs Assessment: post-procedure vital signs reviewed and stable Respiratory status: spontaneous breathing, nonlabored ventilation and respiratory function stable Cardiovascular status: blood pressure returned to baseline and stable Postop Assessment: no apparent nausea or vomiting Anesthetic complications: no   No notable events documented.  Last Vitals:  Vitals:   05/17/21 1445 05/17/21 1500  BP: 114/72 119/84  Pulse: 88 76  Resp: (!) 24 20  Temp:  (!) 36.3 C  SpO2: 100% 100%    Last Pain:  Vitals:   05/17/21 1500  TempSrc:   PainSc: 0-No pain                 Lowella Curb

## 2021-05-20 NOTE — H&P (Signed)
Reason for Consult/CC: Mandibular hardware  Chris Stanley is an 45 y.o. male.  HPI: Patient presents for removal of mandibular hardware.  Allergies:  Allergies  Allergen Reactions   Penicillins Other (See Comments)    Childhood     Medications: No current facility-administered medications for this encounter.  Current Outpatient Medications:    Carboxymethylcellulose Sodium (THERATEARS) 0.25 % SOLN, Place 1 drop into both eyes daily as needed (dry eyes)., Disp: , Rfl:    chlorhexidine (PERIDEX) 0.12 % solution, Use as directed 15 mLs in the mouth or throat 2 (two) times daily., Disp: 120 mL, Rfl: 0   HYDROcodone-acetaminophen (HYCET) 7.5-325 mg/15 ml solution, Take 15 mLs by mouth daily., Disp: , Rfl:    HYDROcodone-acetaminophen (NORCO) 5-325 MG tablet, Take 1 tablet by mouth every 4 (four) hours as needed for moderate pain., Disp: 15 tablet, Rfl: 0   methocarbamol (ROBAXIN) 500 MG tablet, Take 1 tablet (500 mg total) by mouth every 8 (eight) hours as needed for muscle spasms., Disp: 30 tablet, Rfl: 0   Misc Natural Products (OSTEO BI-FLEX JOINT SHIELD PO), Take 30 mLs by mouth daily., Disp: , Rfl:    omeprazole (PRILOSEC OTC) 20 MG tablet, Take 20 mg by mouth daily., Disp: , Rfl:    OVER THE COUNTER MEDICATION, Take 10 mLs by mouth at bedtime. [place], Disp: , Rfl:    bacitracin ointment, Apply topically as needed for wound care. (Patient not taking: Reported on 05/16/2021), Disp: 120 g, Rfl: 0   morphine 20 MG/5ML solution, Take 0.6 mLs (2.4 mg total) by mouth every 4 (four) hours as needed for pain. (Patient not taking: Reported on 05/16/2021), Disp: 20 mL, Rfl: 0   polyethylene glycol (MIRALAX / GLYCOLAX) 17 g packet, Take 17 g by mouth daily as needed for mild constipation or moderate constipation. (Patient not taking: Reported on 05/16/2021), Disp: 14 each, Rfl: 0  Past Medical History:  Diagnosis Date   Anxiety    Bipolar disorder (HCC)    Depression    GERD  (gastroesophageal reflux disease)     Past Surgical History:  Procedure Laterality Date   CLOSED REDUCTION MANDIBLE WITH MANDIBULOMA Bilateral 04/08/2021   Procedure: closed reduction and placement of maxillomandibular fixation for left mandibular fracture;  Surgeon: [name], MD;  Location: [facility];  Service: Plastics;  Laterality: Bilateral;   FRACTURE SURGERY Right 03/2021   MANDIBULAR HARDWARE REMOVAL N/A 05/17/2021   Procedure: Removal of mandibular hardware;  Surgeon: [name], MD;  Location: [facility] OR;  Service: Plastics;  Laterality: N/A;   ORIF RADIAL FRACTURE Right 04/02/2021   Procedure: OPEN REDUCTION INTERNAL FIXATION (ORIF) RADIAL FRACTURE;  Surgeon: Ernest Mallick, MD;  Location: [facility] OR;  Service: Orthopedics;  Laterality: Right;   ORIF ULNAR FRACTURE Right 04/02/2021   Procedure: OPEN REDUCTION INTERNAL FIXATION (ORIF) ULNAR FRACTURE;  Surgeon: Ernest Mallick, MD;  Location: [facility] OR;  Service: Orthopedics;  Laterality: Right;    History reviewed. No pertinent family history.  Social History:  reports that he quit smoking about 14 years ago. His smoking use included cigarettes. He has never used smokeless tobacco. He reports previous alcohol use. He reports current drug use. Frequency: 7.00 times per week. Drug: Marijuana.  Physical Exam Blood pressure 119/84, pulse 76, temperature (!) 97.3 F (36.3 C), resp. rate 20, height 5\' 9"  (1.753 m), weight 77.1 kg, SpO2 100 %. General: No acute distress Cardiovascular: Regular rhythm Pulmonary: Unlabored  No results  found for this or any previous visit (from the past 48 hour(s)).  No results found.  Assessment/Plan: Presents for removal of mandibular hardware.  Risks and benefits discussed.  Proceed with surgery.  [name] 05/20/2021, 2:59 PM

## 2021-06-01 ENCOUNTER — Ambulatory Visit (INDEPENDENT_AMBULATORY_CARE_PROVIDER_SITE_OTHER): Payer: Worker's Compensation | Admitting: Plastic Surgery

## 2021-06-01 ENCOUNTER — Other Ambulatory Visit: Payer: Self-pay

## 2021-06-01 DIAGNOSIS — S02642D Fracture of ramus of left mandible, subsequent encounter for fracture with routine healing: Secondary | ICD-10-CM

## 2021-06-01 NOTE — Progress Notes (Signed)
Patient presents a few weeks out from removal of mandibular hardware.  He had a left mandibular ramus fracture which was minimally displaced and treated with MMF.  He feels like things are going well.  He feels like his occlusion is at baseline.  He does report some concerns regarding the soft tissue volume around his left eye.  He feels that he has a scar that has formed a dented in type appearance in the left temple area.  He also feels like there is some asymmetry with the volume in the area of his zygoma body.  He does have some very subtle asymmetries in terms of the shape of his periorbital area on the left versus the right some of these were pre-existing prior to his trauma.  We will plan to let a few more months go by and if need be we could consider some fat grafting to those areas to help even out the volume.  He is satisfied with this and we will plan to see him at that point.

## 2021-10-05 ENCOUNTER — Ambulatory Visit (INDEPENDENT_AMBULATORY_CARE_PROVIDER_SITE_OTHER): Payer: No Typology Code available for payment source | Admitting: Plastic Surgery

## 2021-10-05 ENCOUNTER — Other Ambulatory Visit: Payer: Self-pay

## 2021-10-05 DIAGNOSIS — S02642D Fracture of ramus of left mandible, subsequent encounter for fracture with routine healing: Secondary | ICD-10-CM | POA: Diagnosis not present

## 2021-10-05 NOTE — Progress Notes (Signed)
   Referring Provider Crist Fat, MD [address] [place],  [place] [address]   CC:  Chief Complaint  Patient presents with   Follow-up      Chris Stanley is an 45 y.o. male.  HPI: Patient presents about 5 months out from facial trauma.  He suffered a minimally displaced fracture through the posterior ramus on the left side of his mandible.  He also had a nondisplaced left ZMC fracture.  He was treated with maxillomandibular fixation which was subsequently removed.  He reports baseline occlusion.  He reports some popping and clicking in his TMJ more on the left side than the right.  He also feels like his left cheekbone is less prominent than it was before.  Reports normal vision but some mild dry eye in the left.  Also reports some tingling sensations around his left eye.  Review of Systems General: Denies fevers and chills  Physical Exam Vitals with BMI 05/17/2021 05/17/2021 05/17/2021  Height - - -  Weight - - -  BMI - - -  Systolic 119 114 226  Diastolic 84 72 65  Pulse 76 88 78    General:  No acute distress,  Alert and oriented, Non-Toxic, Normal speech and affect On exam he looks to have normal and baseline occlusion.  He does have some clicking around the TMJ that is more prominent on the left side that I am able to palpate when he opens and closes his jaw.  Extraocular movements are intact.  There may be a slight decrease in fullness in the left zygomatic area compared to the right but this is very subtle.  Scars from his facial trauma have healed nicely.  Assessment/Plan Patient is approaching 6 months out from his injury.  He continues to have some clicking in his TMJ which may or may not be related to the trauma.  I will plan to refer him to oral surgery for evaluation of that.  I believe his mandible should be well-healed at this point and his occlusion is good.  Regarding the tingling sensations around his left eye he is requesting a referral to neurology and I  think it is reasonable to try for that.  Regarding the contour asymmetry on the left versus the right we did discuss that this is a fairly subtle difference.  It is possible that the very minimal displacement of the ZMC is responsible for this.  It subtle enough that I think a small amount of fat grafting should fill it out and correct the asymmetry.  I discussed the risks of this procedure that include bleeding, infection, damage to surrounding structures need for additional procedures.  I believe this should smooth it out to his satisfaction.  He is interested in moving forward on this and so we will begin to work on it.  Chris Stanley 10/05/2021, 5:22 PM

## 2021-10-11 ENCOUNTER — Telehealth: Payer: Self-pay | Admitting: Plastic Surgery

## 2021-10-11 NOTE — Telephone Encounter (Signed)
Called patient to schedule surgery. Left message requesting callback.

## 2021-10-26 ENCOUNTER — Ambulatory Visit (INDEPENDENT_AMBULATORY_CARE_PROVIDER_SITE_OTHER): Payer: No Typology Code available for payment source | Admitting: Plastic Surgery

## 2021-10-26 ENCOUNTER — Other Ambulatory Visit: Payer: Self-pay

## 2021-10-26 DIAGNOSIS — M952 Other acquired deformity of head: Secondary | ICD-10-CM | POA: Diagnosis not present

## 2021-10-26 DIAGNOSIS — S02642D Fracture of ramus of left mandible, subsequent encounter for fracture with routine healing: Secondary | ICD-10-CM

## 2021-10-26 NOTE — Progress Notes (Signed)
   Referring Provider Crist Fat, MD [address] [place],  [place] [address]   CC:  Chief Complaint  Patient presents with   Follow-up      Chris Stanley is an 45 y.o. male.  HPI: Patient presents for follow-up discussions on planned procedure.  We are planning to do some fat grafting to his left zygomatic area to correct a subtle depression in that area as result of facial trauma.  He has a few questions.  Review of Systems General: Denies fever and chills  Physical Exam Vitals with BMI 05/17/2021 05/17/2021 05/17/2021  Height - - -  Weight - - -  BMI - - -  Systolic 119 114 124  Diastolic 84 72 65  Pulse 76 88 78    General:  No acute distress,  Alert and oriented, Non-Toxic, Normal speech and affect Exam is unchanged.  He has a fairly subtle depression in the zygomatic area on the left side.  We both see this and can tell the difference between his right side.  He also points out a transverse scar in the lateral left forehead and in the lateral [place]hal area.  Assessment/Plan Patient presents for follow-up discussions regarding fat grafting to the left cheek and zygomatic area.  We both seem to be in agreement on the location for grafting.  I explained that I would do my best not to overcorrect and improve the symmetry to address his concerns.  We have both agreed to not revise any scars at this point in time and give them more time to ensure as they would likely continue to improve to some degree.  We reviewed the risks and benefits.  He is interested in moving forward.  Chris Stanley 10/26/2021, 5:26 PM

## 2021-11-16 ENCOUNTER — Other Ambulatory Visit: Payer: Self-pay

## 2021-11-16 ENCOUNTER — Ambulatory Visit (INDEPENDENT_AMBULATORY_CARE_PROVIDER_SITE_OTHER): Payer: No Typology Code available for payment source | Admitting: Surgical

## 2021-11-16 ENCOUNTER — Encounter: Payer: Self-pay | Admitting: Surgical

## 2021-11-16 VITALS — BP 138/90 | HR 88 | Ht 69.0 in | Wt 178.0 lb

## 2021-11-16 DIAGNOSIS — S02642D Fracture of ramus of left mandible, subsequent encounter for fracture with routine healing: Secondary | ICD-10-CM

## 2021-11-16 DIAGNOSIS — M952 Other acquired deformity of head: Secondary | ICD-10-CM

## 2021-11-16 NOTE — H&P (View-Only) (Signed)
Patient ID: Chris Stanley, male    DOB: 09/24/1976, 45 y.o.   MRN: [id number]  Chief Complaint  Patient presents with   Pre-op Exam      ICD-10-CM   1. Closed fracture of left ramus of mandible with routine healing, subsequent encounter  S02.642D     2. Facial asymmetry, acquired  M95.2       History of Present Illness: Chris Stanley is a 45 y.o.  male  with a history of facial trauma.  He presents for preoperative evaluation for upcoming procedure, fat grafting to left cheek, scheduled for 12/02/2021 with Dr. Arita Miss.  The patient has not had problems with anesthesia. No history of DVT/PE.  No family history of DVT/PE.  No family or personal history of bleeding or clotting disorders.  Patient is not currently taking any blood thinners.  No history of CVA/MI.   PMH Significant for: Facial trauma, GERD  Patient is doing well.  No recent changes to his health.  He is seeing a specialist for TMJ, may begin therapy for this soon.  He is also seeing a neurologist and may need an MRI soon.  He reports chronic decreased sensory changes over the left cheek due to his initial facial trauma.   Past Medical History: Allergies: Allergies  Allergen Reactions   Penicillins Other (See Comments)    Childhood     Current Medications:  Current Outpatient Medications:    Carboxymethylcellulose Sodium (THERATEARS) 0.25 % SOLN, Place 1 drop into both eyes daily as needed (dry eyes)., Disp: , Rfl:    Misc Natural Products (OSTEO BI-FLEX JOINT SHIELD PO), Take 30 mLs by mouth daily., Disp: , Rfl:    omeprazole (PRILOSEC OTC) 20 MG tablet, Take 20 mg by mouth daily., Disp: , Rfl:    OVER THE COUNTER MEDICATION, Take 10 mLs by mouth at bedtime. [place], Disp: , Rfl:   Past Medical Problems: Past Medical History:  Diagnosis Date   Anxiety    Bipolar disorder (HCC)    Depression    GERD (gastroesophageal reflux disease)     Past Surgical History: Past Surgical History:   Procedure Laterality Date   CLOSED REDUCTION MANDIBLE WITH MANDIBULOMA Bilateral 04/08/2021   Procedure: closed reduction and placement of maxillomandibular fixation for left mandibular fracture;  Surgeon: [name], MD;  Location: [facility];  Service: Plastics;  Laterality: Bilateral;   FRACTURE SURGERY Right 03/2021   MANDIBULAR HARDWARE REMOVAL N/A 05/17/2021   Procedure: Removal of mandibular hardware;  Surgeon: [name], MD;  Location: [facility];  Service: Plastics;  Laterality: N/A;   ORIF RADIAL FRACTURE Right 04/02/2021   Procedure: OPEN REDUCTION INTERNAL FIXATION (ORIF) RADIAL FRACTURE;  Surgeon: Ernest Mallick, MD;  Location: [facility] OR;  Service: Orthopedics;  Laterality: Right;   ORIF ULNAR FRACTURE Right 04/02/2021   Procedure: OPEN REDUCTION INTERNAL FIXATION (ORIF) ULNAR FRACTURE;  Surgeon: Ernest Mallick, MD;  Location: [facility];  Service: Orthopedics;  Laterality: Right;    Social History: Social History   Socioeconomic History   Marital status: Single    Spouse name: Not on file   Number of children: Not on file   Years of education: Not on file   Highest education level: Not on file  Occupational History   Not on file  Tobacco Use   Smoking status: Former    Years: 2.00    Types: Cigarettes    Quit date: 2008  Years since quitting: 14.9  ° Smokeless tobacco: Never  °Vaping Use  ° Vaping Use: Never used  °Substance and Sexual Activity  ° Alcohol use: Not Currently  ° Drug use: Yes  °  Frequency: 7.0 times per week  °  Types: Marijuana  °  Comment: [employer]  ° Sexual activity: Not on file  °Other Topics Concern  ° Not on file  °Social History Narrative  ° Not on file  ° °Social Determinants of Health  ° °Financial Resource Strain: Not on file  °Food Insecurity: Not on file  °Transportation Needs: Not on file  °Physical Activity: Not on file  °Stress: Not on file  °Social Connections: Not on file  °Intimate Partner Violence: Not on file   ° ° °Family History: °No family history on file. ° °Review of Systems: °Review of Systems  °Constitutional: Negative.   °Respiratory: Negative.    °Cardiovascular: Negative.   ° °Physical Exam: °Vital Signs °BP 138/90 (BP Location: Left Arm, Patient Position: Sitting, Cuff Size: Large)    Pulse 88    Ht 5' 9" (1.753 m)    Wt 178 lb (80.7 kg)    SpO2 99%    BMI 26.29 kg/m²  ° °Physical Exam  °Constitutional:   °   General: Not in acute distress. °   Appearance: Normal appearance. Not ill-appearing.  °HENT:  °   Head: Atraumatic.  Scar noted over left eyebrow laterally.  Mild flattening of left cheek in the zygomatic region. °Eyes:  °   Pupils: Pupils are equal, round °Neck:  °   Musculoskeletal: Normal range of motion.  °Cardiovascular:  °   Rate and Rhythm: Normal rate °   Pulses: Normal pulses.  °Pulmonary:  °   Effort: Pulmonary effort is normal. No respiratory distress.  °Musculoskeletal: Normal range of motion.  °Skin: °   General: Skin is warm and dry.  °   Findings: No erythema or rash.  °Neurological:  °   General: No focal deficit present.  °   Mental Status: Alert and oriented to person, place, and time. Mental status is at baseline.  °   Motor: No weakness.  °Psychiatric:     °   Mood and Affect: Mood normal.     °   Behavior: Behavior normal.  ° ° °Assessment/Plan: °The patient is scheduled for fat grafting to left cheek with Dr. Pace.  Risks, benefits, and alternatives of procedure discussed, questions answered and consent obtained.   ° °Smoking Status: Non-smoker; Counseling Given?  N/A ° °Caprini Score: 4, moderate; Risk Factors include: Age, BMI greater than 25, and length of planned surgery. Recommendation for mechanical prophylaxis. Encourage early ambulation.  ° °Pictures obtained: Pictures were taken today and placed in patient's chart with patient's permission. ° °Post-op Rx sent to pharmacy: Norco, Zofran ° °Patient was provided with the General Surgical Risk consent document and Pain  Medication Agreement prior to their appointment.  They had adequate time to read through the risk consent documents and Pain Medication Agreement. We also discussed them in person together during this preop appointment. All of their questions were answered to their satisfaction.  Recommended calling if they have any further questions.  Risk consent form and Pain Medication Agreement to be scanned into patient's chart. ° °The risks that can be encountered with and after liposuction were discussed and include the following but no limited to these:  °Asymmetry, fluid accumulation, firmness of the area, fat necrosis with death of fat tissue,   bleeding, infection, delayed healing, anesthesia risks, skin sensation changes, injury to structures including nerves, blood vessels, and muscles which may be temporary or permanent, allergies to tape, suture materials and glues, blood products, topical preparations or injected agents, skin and contour irregularities, skin discoloration and swelling, deep vein thrombosis, cardiac and pulmonary complications, pain, which may persist, persistent pain, recurrence of the lesion, poor healing of the incision, possible need for revisional surgery or staged procedures. Thiere can also be persistent swelling, poor wound healing, rippling or loose skin, worsening of cellulite, swelling, and thermal burn or heat injury from ultrasound with the ultrasound-assisted lipoplasty technique. Any change in weight fluctuations can alter the outcome. ° °We discussed risks specific to fat grafting to the left cheek including but not limited to damage to surrounding structures, bruising and swelling, sensory changes, complete loss of the fat graft and possible need for additional procedures. ° ° °Electronically signed by: Honest Vanleer J Camilo Mander, PA-C 11/16/2021 10:09 AM °
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
## 2021-11-22 ENCOUNTER — Encounter (HOSPITAL_BASED_OUTPATIENT_CLINIC_OR_DEPARTMENT_OTHER): Payer: Self-pay | Admitting: Plastic Surgery

## 2021-11-22 ENCOUNTER — Other Ambulatory Visit: Payer: Self-pay

## 2021-11-23 ENCOUNTER — Telehealth: Payer: Self-pay

## 2021-11-23 NOTE — Telephone Encounter (Signed)
Cheri called from [facility] to let us know that she just completed the initial evaluation for patient and to request clarity about post-op rules for upcoming surgery and how it will impact his physical therapy program.  Please call.  Cheri said that she will be in with patients today from 4-7pm.  We may reach her tomorrow from 9-10am and 1-3pm.  She said she will also be available after 1pm on Friday.

## 2021-11-24 NOTE — Telephone Encounter (Signed)
Returned Cheri's call with [facility]. Verified the type of therapy she will be performing are [place] warming, hands on therapy including stretching the jaws and neck. Patient's surgery is scheduled on 12/02/2021 for fat grafting into left check with Dr. Arita Miss. Under the advise of Matt, patient should hold off direct facial PT for approximately 4-6 weeks to avoid fat disruption and necrosis.

## 2021-12-01 NOTE — Progress Notes (Signed)
Surgical soap given with instructions, pt verbalized understanding.  

## 2021-12-02 ENCOUNTER — Ambulatory Visit (HOSPITAL_BASED_OUTPATIENT_CLINIC_OR_DEPARTMENT_OTHER)
Admission: RE | Admit: 2021-12-02 | Discharge: 2021-12-02 | Disposition: A | Discharge status: Home / Self Care | Payer: No Typology Code available for payment source | Attending: Plastic Surgery | Admitting: Plastic Surgery

## 2021-12-02 ENCOUNTER — Encounter (HOSPITAL_BASED_OUTPATIENT_CLINIC_OR_DEPARTMENT_OTHER)
Admission: RE | Disposition: A | Discharge status: Home / Self Care | Payer: Self-pay | Source: Home / Self Care | Attending: Plastic Surgery

## 2021-12-02 ENCOUNTER — Other Ambulatory Visit: Payer: Self-pay

## 2021-12-02 ENCOUNTER — Ambulatory Visit (HOSPITAL_BASED_OUTPATIENT_CLINIC_OR_DEPARTMENT_OTHER): Payer: No Typology Code available for payment source | Admitting: Certified Registered"

## 2021-12-02 ENCOUNTER — Encounter (HOSPITAL_BASED_OUTPATIENT_CLINIC_OR_DEPARTMENT_OTHER): Payer: Self-pay | Admitting: Plastic Surgery

## 2021-12-02 DIAGNOSIS — Z8781 Personal history of (healed) traumatic fracture: Secondary | ICD-10-CM | POA: Diagnosis not present

## 2021-12-02 DIAGNOSIS — M952 Other acquired deformity of head: Secondary | ICD-10-CM | POA: Insufficient documentation

## 2021-12-02 DIAGNOSIS — K219 Gastro-esophageal reflux disease without esophagitis: Secondary | ICD-10-CM | POA: Insufficient documentation

## 2021-12-02 HISTORY — PX: LIPOSUCTION WITH LIPOFILLING: SHX6436

## 2021-12-02 SURGERY — LIPOSUCTION, WITH FAT TRANSFER
Anesthesia: General | Site: Face | Laterality: Left

## 2021-12-02 MED ORDER — SUGAMMADEX SODIUM 500 MG/5ML IV SOLN
INTRAVENOUS | Status: DC | PRN
  Administered 2021-12-02: 300 mg via INTRAVENOUS

## 2021-12-02 MED ORDER — SUGAMMADEX SODIUM 500 MG/5ML IV SOLN
INTRAVENOUS | Status: AC
  Filled 2021-12-02: qty 5

## 2021-12-02 MED ORDER — CHLORHEXIDINE GLUCONATE CLOTH 2 % EX PADS: 6.0000 | MEDICATED_PAD | Freq: Once | CUTANEOUS | Status: DC

## 2021-12-02 MED ORDER — PROMETHAZINE HCL 25 MG/ML IJ SOLN: 6.2500 mg | INTRAMUSCULAR | Status: DC | PRN

## 2021-12-02 MED ORDER — LACTATED RINGERS IV SOLN
INTRAVENOUS | Status: DC | PRN
  Administered 2021-12-02: 450 mL

## 2021-12-02 MED ORDER — LIDOCAINE 2% (20 MG/ML) 5 ML SYRINGE
INTRAMUSCULAR | Status: AC
  Filled 2021-12-02: qty 5

## 2021-12-02 MED ORDER — LACTATED RINGERS IV SOLN: INTRAVENOUS | Status: DC

## 2021-12-02 MED ORDER — DEXAMETHASONE SODIUM PHOSPHATE 4 MG/ML IJ SOLN
INTRAMUSCULAR | Status: DC | PRN
  Administered 2021-12-02: 10 mg via INTRAVENOUS

## 2021-12-02 MED ORDER — PROPOFOL 10 MG/ML IV BOLUS
INTRAVENOUS | Status: DC | PRN
Start: 2021-12-02 — End: 2021-12-02
  Administered 2021-12-02: 200 mg via INTRAVENOUS

## 2021-12-02 MED ORDER — OXYCODONE HCL 5 MG/5ML PO SOLN: 5.0000 mg | Freq: Once | ORAL | Status: DC | PRN

## 2021-12-02 MED ORDER — ONDANSETRON HCL 4 MG/2ML IJ SOLN
INTRAMUSCULAR | Status: DC | PRN
  Administered 2021-12-02: 4 mg via INTRAVENOUS

## 2021-12-02 MED ORDER — ROCURONIUM BROMIDE 100 MG/10ML IV SOLN
INTRAVENOUS | Status: DC | PRN
  Administered 2021-12-02: 60 mg via INTRAVENOUS

## 2021-12-02 MED ORDER — MIDAZOLAM HCL 2 MG/2ML IJ SOLN
INTRAMUSCULAR | Status: AC
  Filled 2021-12-02: qty 2

## 2021-12-02 MED ORDER — EPINEPHRINE PF 1 MG/ML IJ SOLN
INTRAMUSCULAR | Status: AC
  Filled 2021-12-02: qty 1

## 2021-12-02 MED ORDER — FENTANYL CITRATE (PF) 100 MCG/2ML IJ SOLN
INTRAMUSCULAR | Status: AC
  Filled 2021-12-02: qty 2

## 2021-12-02 MED ORDER — LIDOCAINE HCL (CARDIAC) PF 100 MG/5ML IV SOSY
PREFILLED_SYRINGE | INTRAVENOUS | Status: DC | PRN
  Administered 2021-12-02: 100 mg via INTRAVENOUS

## 2021-12-02 MED ORDER — CLINDAMYCIN PHOSPHATE 900 MG/50ML IV SOLN
900.0000 mg | INTRAVENOUS | Status: AC
  Administered 2021-12-02: 900 mg via INTRAVENOUS

## 2021-12-02 MED ORDER — ROCURONIUM BROMIDE 10 MG/ML (PF) SYRINGE
PREFILLED_SYRINGE | INTRAVENOUS | Status: AC
  Filled 2021-12-02: qty 10

## 2021-12-02 MED ORDER — BUPIVACAINE HCL (PF) 0.25 % IJ SOLN
INTRAMUSCULAR | Status: AC
  Filled 2021-12-02: qty 30

## 2021-12-02 MED ORDER — FENTANYL CITRATE (PF) 100 MCG/2ML IJ SOLN
INTRAMUSCULAR | Status: DC | PRN
  Administered 2021-12-02: 100 ug via INTRAVENOUS

## 2021-12-02 MED ORDER — CLINDAMYCIN PHOSPHATE 900 MG/50ML IV SOLN
INTRAVENOUS | Status: AC
  Filled 2021-12-02: qty 50

## 2021-12-02 MED ORDER — PROPOFOL 10 MG/ML IV BOLUS
INTRAVENOUS | Status: AC
  Filled 2021-12-02: qty 20

## 2021-12-02 MED ORDER — FENTANYL CITRATE (PF) 100 MCG/2ML IJ SOLN: 25.0000 ug | INTRAMUSCULAR | Status: DC | PRN

## 2021-12-02 MED ORDER — KETOROLAC TROMETHAMINE 30 MG/ML IJ SOLN: 30.0000 mg | Freq: Once | INTRAMUSCULAR | Status: DC | PRN

## 2021-12-02 MED ORDER — ONDANSETRON HCL 4 MG/2ML IJ SOLN
INTRAMUSCULAR | Status: AC
  Filled 2021-12-02: qty 2

## 2021-12-02 MED ORDER — DEXAMETHASONE SODIUM PHOSPHATE 10 MG/ML IJ SOLN
INTRAMUSCULAR | Status: AC
  Filled 2021-12-02: qty 1

## 2021-12-02 MED ORDER — MIDAZOLAM HCL 5 MG/5ML IJ SOLN
INTRAMUSCULAR | Status: DC | PRN
Start: 2021-12-02 — End: 2021-12-02
  Administered 2021-12-02: 2 mg via INTRAVENOUS

## 2021-12-02 MED ORDER — OXYCODONE HCL 5 MG PO TABS: 5.0000 mg | ORAL_TABLET | Freq: Once | ORAL | Status: DC | PRN

## 2021-12-02 SURGICAL SUPPLY — 64 items
APL PRP STRL LF DISP 70% ISPRP (MISCELLANEOUS) ×1
BINDER ABDOMINAL 10 UNV 27-48 (MISCELLANEOUS) IMPLANT
BINDER ABDOMINAL 12 SM 30-45 (SOFTGOODS) IMPLANT
BINDER BREAST LRG (GAUZE/BANDAGES/DRESSINGS) IMPLANT
BINDER BREAST XLRG (GAUZE/BANDAGES/DRESSINGS) IMPLANT
BINDER BREAST XXLRG (GAUZE/BANDAGES/DRESSINGS) IMPLANT
BLADE SURG 10 STRL SS (BLADE) IMPLANT
BLADE SURG 11 STRL SS (BLADE) ×2 IMPLANT
BNDG CMPR MED 10X6 ELC LF (GAUZE/BANDAGES/DRESSINGS)
BNDG ELASTIC 6X10 VLCR STRL LF (GAUZE/BANDAGES/DRESSINGS) IMPLANT
CANISTER SUCT 1200ML W/VALVE (MISCELLANEOUS) IMPLANT
CHLORAPREP W/TINT 26 (MISCELLANEOUS) ×2 IMPLANT
COVER BACK TABLE 60X90IN (DRAPES) ×2 IMPLANT
COVER MAYO STAND STRL (DRAPES) ×3 IMPLANT
DECANTER SPIKE VIAL GLASS SM (MISCELLANEOUS) IMPLANT
DRAPE TOP ARMCOVERS (MISCELLANEOUS) ×1 IMPLANT
DRAPE U-SHAPE 76X120 STRL (DRAPES) ×2 IMPLANT
DRAPE UTILITY XL STRL (DRAPES) ×1 IMPLANT
DRSG PAD ABDOMINAL 8X10 ST (GAUZE/BANDAGES/DRESSINGS) ×2 IMPLANT
DRSG TELFA 3X8 NADH (GAUZE/BANDAGES/DRESSINGS) ×6 IMPLANT
ELECT COATED BLADE 2.86 ST (ELECTRODE) IMPLANT
ELECT REM PT RETURN 9FT ADLT (ELECTROSURGICAL) ×2
ELECTRODE REM PT RTRN 9FT ADLT (ELECTROSURGICAL) ×1 IMPLANT
EXTRACTOR CANIST REVOLVE STRL (CANNISTER) ×1 IMPLANT
GAUZE 4X4 16PLY ~~LOC~~+RFID DBL (SPONGE) ×1 IMPLANT
GLOVE SRG 8 PF TXTR STRL LF DI (GLOVE) ×1 IMPLANT
GLOVE SURG ENC TEXT LTX SZ7.5 (GLOVE) ×2 IMPLANT
GLOVE SURG POLYISO LF SZ7 (GLOVE) ×1 IMPLANT
GLOVE SURG UNDER POLY LF SZ7 (GLOVE) ×2 IMPLANT
GLOVE SURG UNDER POLY LF SZ8 (GLOVE) ×2
GOWN STRL REUS W/ TWL LRG LVL3 (GOWN DISPOSABLE) ×2 IMPLANT
GOWN STRL REUS W/ TWL XL LVL3 (GOWN DISPOSABLE) IMPLANT
GOWN STRL REUS W/TWL LRG LVL3 (GOWN DISPOSABLE) ×4
GOWN STRL REUS W/TWL XL LVL3 (GOWN DISPOSABLE) ×2
LINER CANISTER 1000CC FLEX (MISCELLANEOUS) ×1 IMPLANT
NDL SAFETY ECLIPSE 18X1.5 (NEEDLE) ×1 IMPLANT
NEEDLE HYPO 18GX1.5 SHARP (NEEDLE)
NS IRRIG 1000ML POUR BTL (IV SOLUTION) ×1 IMPLANT
PACK BASIN DAY SURGERY FS (CUSTOM PROCEDURE TRAY) ×2 IMPLANT
PAD ALCOHOL SWAB (MISCELLANEOUS) ×1 IMPLANT
PAD DRESSING TELFA 3X8 NADH (GAUZE/BANDAGES/DRESSINGS) IMPLANT
PENCIL SMOKE EVACUATOR (MISCELLANEOUS) IMPLANT
SHEET MEDIUM DRAPE 40X70 STRL (DRAPES) ×2 IMPLANT
SLEEVE SCD COMPRESS KNEE MED (STOCKING) ×2 IMPLANT
SPONGE T-LAP 18X18 ~~LOC~~+RFID (SPONGE) ×1 IMPLANT
STAPLER VISISTAT 35W (STAPLE) IMPLANT
STRIP SUTURE WOUND CLOSURE 1/2 (MISCELLANEOUS) ×1 IMPLANT
SUT CHROMIC 5 0 RB 1 27 (SUTURE) ×1 IMPLANT
SUT MNCRL AB 4-0 PS2 18 (SUTURE) IMPLANT
SUT PLAIN 5 0 P 3 18 (SUTURE) ×1 IMPLANT
SYR 10ML LL (SYRINGE) ×3 IMPLANT
SYR 50ML LL SCALE MARK (SYRINGE) IMPLANT
SYR BULB IRRIG 60ML STRL (SYRINGE) IMPLANT
SYR CONTROL 10ML LL (SYRINGE) IMPLANT
SYR TB 1ML LL NO SAFETY (SYRINGE) ×1 IMPLANT
SYR TOOMEY IRRIG 70ML (MISCELLANEOUS)
SYRINGE TOOMEY IRRIG 70ML (MISCELLANEOUS) IMPLANT
TOWEL GREEN STERILE FF (TOWEL DISPOSABLE) ×4 IMPLANT
TRAY DSU PREP LF (CUSTOM PROCEDURE TRAY) ×1 IMPLANT
TUBE CONNECTING 20X1/4 (TUBING) IMPLANT
TUBING INFILTRATION IT-10001 (TUBING) ×2 IMPLANT
TUBING SET GRADUATE ASPIR 12FT (MISCELLANEOUS) ×1 IMPLANT
UNDERPAD 30X36 HEAVY ABSORB (UNDERPADS AND DIAPERS) ×2 IMPLANT
YANKAUER SUCT BULB TIP NO VENT (SUCTIONS) IMPLANT

## 2021-12-02 NOTE — Interval H&P Note (Signed)
History and Physical Interval Note:  12/02/2021 10:06 AM  Chris Stanley  has presented today for surgery, with the diagnosis of Closed fracture of left ramus of mandible with routine healing, subsequent encounter.  The various methods of treatment have been discussed with the patient and family. After consideration of risks, benefits and other options for treatment, the patient has consented to  Procedure(s): Fat Grafting Left Cheek (Left) as a surgical intervention.  The patient's history has been reviewed, patient examined, no change in status, stable for surgery.  I have reviewed the patient's chart and labs.  Questions were answered to the patient's satisfaction.     Chris Stanley

## 2021-12-02 NOTE — Anesthesia Postprocedure Evaluation (Signed)
Anesthesia Post Note  Patient: Chris Stanley  Procedure(s) Performed: Fat Grafting Left Cheek from abdomen (Left: Face)     Patient location during evaluation: PACU Anesthesia Type: General Level of consciousness: awake and alert Pain management: pain level controlled Vital Signs Assessment: post-procedure vital signs reviewed and stable Respiratory status: spontaneous breathing, nonlabored ventilation, respiratory function stable and patient connected to nasal cannula oxygen Cardiovascular status: blood pressure returned to baseline and stable Postop Assessment: no apparent nausea or vomiting Anesthetic complications: no   No notable events documented.  Last Vitals:  Vitals:   12/02/21 0841 12/02/21 1140  BP: 118/80 102/77  Pulse: (!) 59 75  Resp: 18 16  Temp: 36.7 C   SpO2: 100% 100%    Last Pain:  Vitals:   12/02/21 1140  TempSrc:   PainSc: 0-No pain                 Qiara Minetti S

## 2021-12-02 NOTE — Anesthesia Preprocedure Evaluation (Signed)
Anesthesia Evaluation  Patient identified by MRN, date of birth, ID band Patient awake    Reviewed: Allergy & Precautions, NPO status , Patient's Chart, lab work & pertinent test results  Airway Mallampati: II  TM Distance: >3 FB Neck ROM: Full    Dental no notable dental hx.    Pulmonary neg pulmonary ROS, former smoker,    Pulmonary exam normal breath sounds clear to auscultation       Cardiovascular negative cardio ROS Normal cardiovascular exam Rhythm:Regular Rate:Normal     Neuro/Psych Bipolar Disorder negative neurological ROS     GI/Hepatic Neg liver ROS, GERD  ,  Endo/Other  negative endocrine ROS  Renal/GU negative Renal ROS  negative genitourinary   Musculoskeletal negative musculoskeletal ROS (+)   Abdominal   Peds negative pediatric ROS (+)  Hematology negative hematology ROS (+)   Anesthesia Other Findings   Reproductive/Obstetrics negative OB ROS                             Anesthesia Physical Anesthesia Plan  ASA: 2  Anesthesia Plan: General   Post-op Pain Management:    Induction: Intravenous  PONV Risk Score and Plan: 2 and Ondansetron, Dexamethasone and Treatment may vary due to age or medical condition  Airway Management Planned: LMA  Additional Equipment:   Intra-op Plan:   Post-operative Plan: Extubation in OR  Informed Consent: I have reviewed the patients History and Physical, chart, labs and discussed the procedure including the risks, benefits and alternatives for the proposed anesthesia with the patient or authorized representative who has indicated his/her understanding and acceptance.     Dental advisory given  Plan Discussed with: CRNA and Surgeon  Anesthesia Plan Comments:         Anesthesia Quick Evaluation

## 2021-12-02 NOTE — Transfer of Care (Signed)
Immediate Anesthesia Transfer of Care Note  Patient: Chris Stanley  Procedure(s) Performed: Fat Grafting Left Cheek from abdomen (Left: Face)  Patient Location: PACU  Anesthesia Type:General  Level of Consciousness: awake, alert , oriented, drowsy and patient cooperative  Airway & Oxygen Therapy: Patient Spontanous Breathing and Patient connected to face mask oxygen  Post-op Assessment: Report given to RN and Post -op Vital signs reviewed and stable  Post vital signs: Reviewed and stable  Last Vitals:  Vitals Value Taken Time  BP    Temp    Pulse    Resp    SpO2      Last Pain:  Vitals:   12/02/21 0841  TempSrc: Oral  PainSc: 0-No pain      Patients Stated Pain Goal: 2 (12/02/21 0841)  Complications: No notable events documented.

## 2021-12-02 NOTE — Anesthesia Procedure Notes (Signed)
Procedure Name: Intubation Date/Time: 12/02/2021 10:41 AM Performed by: Verita Lamb, CRNA Pre-anesthesia Checklist: Patient identified, Emergency Drugs available, Suction available and Patient being monitored Patient Re-evaluated:Patient Re-evaluated prior to induction Oxygen Delivery Method: Circle system utilized Preoxygenation: Pre-oxygenation with 100% oxygen Induction Type: IV induction Ventilation: Mask ventilation without difficulty Laryngoscope Size: 4 and Mac Grade View: Grade I Tube type: Oral Rae Tube size: 7.0 mm Number of attempts: 1 Airway Equipment and Method: Stylet and Oral airway Placement Confirmation: ETT inserted through vocal cords under direct vision, positive ETCO2, breath sounds checked- equal and bilateral and CO2 detector Secured at: 23 cm Tube secured with: Tape Dental Injury: Teeth and Oropharynx as per pre-operative assessment

## 2021-12-02 NOTE — Op Note (Signed)
Operative Note   DATE OF OPERATION: 12/02/2021  SURGICAL DEPARTMENT: Plastic Surgery  PREOPERATIVE DIAGNOSES: Left cheek asymmetry after trauma  POSTOPERATIVE DIAGNOSES:  same  PROCEDURE: Fat grafting to the left cheek totaling 4 cc  SURGEON: Ancil Linsey, MD  ASSISTANT: Evelena Leyden, PA The advanced practice practitioner (APP) assisted throughout the case.  The APP was essential in retraction and counter traction when needed to make the case progress smoothly.  This retraction and assistance made it possible to see the tissue planes for the procedure.  The assistance was needed for hemostasis, tissue re-approximation and closure of the incision site.   ANESTHESIA:  General.   COMPLICATIONS: None.   INDICATIONS FOR PROCEDURE:  The patient, Chris Stanley is a 46 y.o. male born on 09/25/1976, is here for treatment of cheek asymmetry after trauma MRN: [id number]  CONSENT:  Informed consent was obtained directly from the patient. Risks, benefits and alternatives were fully discussed. Specific risks including but not limited to bleeding, infection, hematoma, seroma, scarring, pain, contracture, asymmetry, wound healing problems, and need for further surgery were all discussed. The patient did have an ample opportunity to have questions answered to satisfaction.   DESCRIPTION OF PROCEDURE:  The patient was taken to the operating room. SCDs were placed and antibiotics were given.  General anesthesia was administered.  The patient's operative site was prepped and draped in a sterile fashion. A time out was performed and all information was confirmed to be correct.  Started by infiltrating tumescent solution in the lower abdomen.  This was given time to work.  Hand-held liposuction was then done with a 50 cc syringe and Luer-Lok cannula.  Fat was then placed onto Telfa and dried and processed.  It was then placed in a 3 cc syringe.  11 blade was used to make a small incision in  the medial aspect of the left cheek.  The previously drawn out area was then infiltrated with approximately 4 cc of fat in the deep subcutaneous layer.  This seemed to give a nice on table correction of the asymmetry.  The liposuction port was closed with a single interrupted 5-0 plain gut suture and a Steri-Strip was placed over the cheek access point.  The patient tolerated the procedure well.  There were no complications. The patient was allowed to wake from anesthesia, extubated and taken to the recovery room in satisfactory condition.

## 2021-12-02 NOTE — Discharge Instructions (Addendum)
Activity: As tolerated, but avoid strenuous activity until follow up visit.  Diet: Regular  Wound Care: Please leave the Steri-Strip in place.  The suture over abdominal wound will absorbable can be removed at your postop appointment.  No showering restrictions.  If the Steri-Strip falls off in the next couple of days, that is okay.  If that were to happen, place a small Band-Aid over that incision.  Special Instructions:  Call our office if any unusual problems occur such as pain, excessive bleeding, unrelieved nausea/vomiting, fever &/or chills.  Follow-up appointment: Scheduled for next week.    Post Anesthesia Home Care Instructions  Activity: Get plenty of rest for the remainder of the day. A responsible individual must stay with you for 24 hours following the procedure.  For the next 24 hours, DO NOT: -Drive a car -Advertising copywriter -Drink alcoholic beverages -Take any medication unless instructed by your physician -Make any legal decisions or sign important papers.  Meals: Start with liquid foods such as gelatin or soup. Progress to regular foods as tolerated. Avoid greasy, spicy, heavy foods. If nausea and/or vomiting occur, drink only clear liquids until the nausea and/or vomiting subsides. Call your physician if vomiting continues.  Special Instructions/Symptoms: Your throat may feel dry or sore from the anesthesia or the breathing tube placed in your throat during surgery. If this causes discomfort, gargle with warm salt water. The discomfort should disappear within 24 hours.  If you had a scopolamine patch placed behind your ear for the management of post- operative nausea and/or vomiting:  1. The medication in the patch is effective for 72 hours, after which it should be removed.  Wrap patch in a tissue and discard in the trash. Wash hands thoroughly with soap and water. 2. You may remove the patch earlier than 72 hours if you experience unpleasant side effects which  may include dry mouth, dizziness or visual disturbances. 3. Avoid touching the patch. Wash your hands with soap and water after contact with the patch.

## 2021-12-05 ENCOUNTER — Encounter (HOSPITAL_BASED_OUTPATIENT_CLINIC_OR_DEPARTMENT_OTHER): Payer: Self-pay | Admitting: Plastic Surgery

## 2021-12-05 NOTE — Progress Notes (Signed)
Left message stating courtesy call and if any questions or concerns please call the doctors office.  

## 2021-12-05 NOTE — Addendum Note (Signed)
Addendum  created 12/05/21 1207 by Alford Highland, CRNA   Charge Capture section accepted

## 2021-12-08 ENCOUNTER — Other Ambulatory Visit: Payer: Self-pay

## 2021-12-08 ENCOUNTER — Ambulatory Visit (INDEPENDENT_AMBULATORY_CARE_PROVIDER_SITE_OTHER): Payer: No Typology Code available for payment source | Admitting: Plastic Surgery

## 2021-12-08 DIAGNOSIS — M952 Other acquired deformity of head: Secondary | ICD-10-CM
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
## 2022-02-09 ENCOUNTER — Other Ambulatory Visit: Payer: Self-pay

## 2022-02-09 ENCOUNTER — Ambulatory Visit (INDEPENDENT_AMBULATORY_CARE_PROVIDER_SITE_OTHER): Payer: No Typology Code available for payment source | Admitting: Plastic Surgery

## 2022-02-09 DIAGNOSIS — L910 Hypertrophic scar: Secondary | ICD-10-CM

## 2022-02-09 DIAGNOSIS — M952 Other acquired deformity of head: Secondary | ICD-10-CM
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
## 2022-03-01 ENCOUNTER — Ambulatory Visit (INDEPENDENT_AMBULATORY_CARE_PROVIDER_SITE_OTHER): Payer: No Typology Code available for payment source | Admitting: Plastic Surgery

## 2022-03-01 ENCOUNTER — Other Ambulatory Visit: Payer: Self-pay

## 2022-03-01 ENCOUNTER — Encounter: Payer: Self-pay | Admitting: Plastic Surgery

## 2022-03-01 VITALS — BP 141/94 | HR 95 | Ht 69.5 in | Wt 173.0 lb

## 2022-03-01 DIAGNOSIS — L910 Hypertrophic scar: Secondary | ICD-10-CM
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
## 2022-03-02 ENCOUNTER — Telehealth: Payer: Self-pay | Admitting: Plastic Surgery

## 2022-03-02 NOTE — Telephone Encounter (Signed)
Called and spoke with the patient and informed him that we informed Dr. Claudia Desanctis of his message, and he stated that yes it is normal for the area to still be numb.  Patient verbalized understanding and stated that he also has some bruising, and he asked if that was normal.   ? ?Informed him that bruising was normal as well.  Patient verbalized understanding and agreed.//AB/CMA ?

## 2022-03-02 NOTE — Telephone Encounter (Signed)
Yes.  Normal.

## 2022-03-02 NOTE — Telephone Encounter (Signed)
Patient is calling in stating that the scar he had the procedure performed on is still numb to the touch a day later. He wants to know if this is normal and to consult with provider. ?

## 2022-03-16 ENCOUNTER — Telehealth: Payer: Self-pay | Admitting: Internal Medicine

## 2022-03-16 ENCOUNTER — Ambulatory Visit (INDEPENDENT_AMBULATORY_CARE_PROVIDER_SITE_OTHER): Payer: No Typology Code available for payment source | Admitting: Plastic Surgery

## 2022-03-16 ENCOUNTER — Telehealth: Payer: Self-pay

## 2022-03-16 DIAGNOSIS — L910 Hypertrophic scar: Secondary | ICD-10-CM

## 2022-03-16 NOTE — Telephone Encounter (Signed)
Patient's case manager Chris Stanley came to appointment today; requested for copy of office visit notes to be faxed to her when they're ready. Her fax number is [phone]. Her contact numbers are [phone], or [phone]. ? ?Please advise. ? ? ?

## 2022-03-16 NOTE — Telephone Encounter (Signed)
Successfully faxed requested information to Marrianne Mood at [facility] (fax #[phone]) on 03/16/2022. ?

## 2022-03-16 NOTE — Telephone Encounter (Signed)
Faxed recent office notes to Leonia Reeves.  Confirmation received.//AB/CMA ?
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
## 2022-03-17 ENCOUNTER — Telehealth: Payer: Self-pay

## 2022-03-17 NOTE — Telephone Encounter (Signed)
Successfully faxed requested information to Marrianne Mood, RN, CCM ([facility]) at [phone] on 12/26/2021. ?
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
## 2023-06-25 ENCOUNTER — Encounter: Payer: Medicaid Other | Admitting: Internal Medicine

## 2023-07-25 ENCOUNTER — Encounter: Payer: Medicaid Other | Admitting: Internal Medicine
